# Patient Record
Sex: Female | Born: 1935 | Race: White | Hispanic: No | Smoking: Never smoker
Health system: Southern US, Community
[De-identification: ages and names within clinical notes are randomized; demographics above are authoritative.]

## PROBLEM LIST (undated history)

## (undated) DIAGNOSIS — I4891 Unspecified atrial fibrillation: Secondary | ICD-10-CM

## (undated) DIAGNOSIS — J449 Chronic obstructive pulmonary disease, unspecified: Secondary | ICD-10-CM

## (undated) DIAGNOSIS — J479 Bronchiectasis, uncomplicated: Secondary | ICD-10-CM

## (undated) DIAGNOSIS — J45909 Unspecified asthma, uncomplicated: Secondary | ICD-10-CM

## (undated) DIAGNOSIS — F419 Anxiety disorder, unspecified: Secondary | ICD-10-CM

## (undated) DIAGNOSIS — B029 Zoster without complications: Secondary | ICD-10-CM

## (undated) DIAGNOSIS — I471 Supraventricular tachycardia: Secondary | ICD-10-CM

## (undated) DIAGNOSIS — T7840XA Allergy, unspecified, initial encounter: Secondary | ICD-10-CM

## (undated) DIAGNOSIS — I10 Essential (primary) hypertension: Secondary | ICD-10-CM

## (undated) DIAGNOSIS — J918 Pleural effusion in other conditions classified elsewhere: Secondary | ICD-10-CM

## (undated) DIAGNOSIS — E43 Unspecified severe protein-calorie malnutrition: Secondary | ICD-10-CM

## (undated) DIAGNOSIS — J189 Pneumonia, unspecified organism: Secondary | ICD-10-CM

## (undated) DIAGNOSIS — Z9981 Dependence on supplemental oxygen: Secondary | ICD-10-CM

## (undated) HISTORY — DX: Anxiety disorder, unspecified: F41.9

## (undated) HISTORY — DX: Allergy, unspecified, initial encounter: T78.40XA

## (undated) HISTORY — DX: Zoster without complications: B02.9

## (undated) HISTORY — DX: Essential (primary) hypertension: I10

## (undated) HISTORY — DX: Supraventricular tachycardia: I47.1

## (undated) HISTORY — DX: Bronchiectasis, uncomplicated: J47.9

## (undated) HISTORY — DX: Pneumonia, unspecified organism: J18.9

## (undated) HISTORY — DX: Unspecified asthma, uncomplicated: J45.909

## (undated) HISTORY — PX: PILONIDAL CYST / SINUS EXCISION: SUR543

## (undated) HISTORY — DX: Chronic obstructive pulmonary disease, unspecified: J44.9

## (undated) HISTORY — DX: Pneumonia, unspecified organism: J91.8

## (undated) HISTORY — PX: BREAST SURGERY: SHX581

## (undated) HISTORY — DX: Unspecified atrial fibrillation: I48.91

## (undated) HISTORY — DX: Unspecified severe protein-calorie malnutrition: E43

## (undated) HISTORY — PX: APPENDECTOMY: SHX54

## (undated) HISTORY — PX: CATARACT EXTRACTION: SUR2

## (undated) HISTORY — DX: Dependence on supplemental oxygen: Z99.81

---

## 2019-01-02 ENCOUNTER — Non-Acute Institutional Stay (SKILLED_NURSING_FACILITY): Payer: Medicare HMO | Admitting: Adult Health

## 2019-01-02 ENCOUNTER — Encounter: Payer: Self-pay | Admitting: Adult Health

## 2019-01-02 ENCOUNTER — Inpatient Hospital Stay
Admission: RE | Admit: 2019-01-02 | Discharge: 2019-01-21 | Disposition: A | Discharge status: Home / Self Care | Payer: Medicare HMO | Source: Ambulatory Visit | Attending: Internal Medicine | Admitting: Internal Medicine

## 2019-01-02 DIAGNOSIS — J479 Bronchiectasis, uncomplicated: Secondary | ICD-10-CM

## 2019-01-02 DIAGNOSIS — D638 Anemia in other chronic diseases classified elsewhere: Secondary | ICD-10-CM

## 2019-01-02 DIAGNOSIS — R627 Adult failure to thrive: Secondary | ICD-10-CM | POA: Insufficient documentation

## 2019-01-02 DIAGNOSIS — E43 Unspecified severe protein-calorie malnutrition: Secondary | ICD-10-CM

## 2019-01-02 DIAGNOSIS — J439 Emphysema, unspecified: Secondary | ICD-10-CM | POA: Diagnosis not present

## 2019-01-02 DIAGNOSIS — I4891 Unspecified atrial fibrillation: Secondary | ICD-10-CM

## 2019-01-02 DIAGNOSIS — J9611 Chronic respiratory failure with hypoxia: Secondary | ICD-10-CM | POA: Insufficient documentation

## 2019-01-02 DIAGNOSIS — B0229 Other postherpetic nervous system involvement: Secondary | ICD-10-CM | POA: Diagnosis not present

## 2019-01-02 DIAGNOSIS — J90 Pleural effusion, not elsewhere classified: Principal | ICD-10-CM

## 2019-01-02 DIAGNOSIS — Z938 Other artificial opening status: Secondary | ICD-10-CM | POA: Insufficient documentation

## 2019-01-02 DIAGNOSIS — I10 Essential (primary) hypertension: Secondary | ICD-10-CM

## 2019-01-02 DIAGNOSIS — Z9981 Dependence on supplemental oxygen: Secondary | ICD-10-CM

## 2019-01-02 DIAGNOSIS — J189 Pneumonia, unspecified organism: Secondary | ICD-10-CM | POA: Insufficient documentation

## 2019-01-02 DIAGNOSIS — F419 Anxiety disorder, unspecified: Secondary | ICD-10-CM

## 2019-01-02 NOTE — Progress Notes (Signed)
Location:   [place] Room Number: 102 P Place of Service:  SNF (31)   CODE STATUS: DNR  Allergies  Allergen Reactions  . Vibramycin [Doxycycline Calcium]     Chief Complaint  Patient presents with  . Hospitalization Follow-up    HPI:  She is a 85 year old woman who has been hospitalized from 12-17-18 through 01-02-19. She is has a history of chronic bronchiectasis 02 dependent. She has had shingles in Jan of this year on her back.  She had worsening shortness of breath with any exertion. She was treated for pneumonia in the right lung; pleural effusion requiring a chest tube. She was treated with vanc, cefepime; flagyl. Her chest tube has been removed. For her afib rvr: has been treated with amiodarone and started on elsqius. She is on chronic 02 therapy. She is here for short term rehab with her goal to return back home. She does have fatigue shortness of breath; weight loss. She will continue to followed for her chronic illnesses: afib; bronchiectasis; plumonary emphysema. .   Past Medical History:  Diagnosis Date  . Allergy   . Anxiety   . Asthma   . Atrial fibrillation with RVR ([facility])   . Bronchiectasis ([facility])   . COPD (chronic obstructive pulmonary disease) ([facility])   . Hypertension   . On home oxygen therapy   . Severe protein-calorie malnutrition ([facility])   . Shingles    Jan 2020; on back   . SVT (supraventricular tachycardia) ([facility])     Past Surgical History:  Procedure Laterality Date  . APPENDECTOMY    . BREAST SURGERY    . CATARACT EXTRACTION    . PILONIDAL CYST / SINUS EXCISION      Social History   Socioeconomic History  . Marital status: Widowed    Spouse name: Not on file  . Number of children: 1  . Years of education: Not on file  . Highest education level: Not on file  Occupational History  . Not on file  Social Needs  . Financial resource strain: Not on file  . Food insecurity:    Worry: Not on file    Inability: Not on  file  . Transportation needs:    Medical: Not on file    Non-medical: Not on file  Tobacco Use  . Smoking status: Never Smoker  . Smokeless tobacco: Never Used  Substance and Sexual Activity  . Alcohol use: Never    Frequency: Never  . Drug use: Never  . Sexual activity: Not Currently  Lifestyle  . Physical activity:    Days per week: Not on file    Minutes per session: Not on file  . Stress: Not on file  Relationships  . Social connections:    Talks on phone: Not on file    Gets together: Not on file    Attends religious service: Not on file    Active member of club or organization: Not on file    Attends meetings of clubs or organizations: Not on file    Relationship status: Not on file  . Intimate partner violence:    Fear of current or ex partner: Not on file    Emotionally abused: Not on file    Physically abused: Not on file    Forced sexual activity: Not on file  Other Topics Concern  . Not on file  Social History Narrative  . Not on file   Family History  Problem Relation  Age of Onset  . Hypertension Sister   . Heart disease Brother       VITAL SIGNS BP (!) 108/56   Pulse 78   Temp 98.2 F (36.8 C)   Resp 16   Ht 5' 6"  (1.676 m)   Wt 101 lb 6.4 oz (46 kg)   SpO2 95%   BMI 16.37 kg/m   Outpatient Encounter Medications as of 01/02/2019  Medication Sig  . acetaminophen (TYLENOL) 500 MG tablet Take 500 mg by mouth every 6 (six) hours as needed.  Marland Kitchen albuterol (PROVENTIL) (2.5 MG/3ML) 0.083% nebulizer solution Take 2.5 mg by nebulization every 4 (four) hours as needed for wheezing or shortness of breath.  . ALPRAZolam (XANAX) 0.25 MG tablet Take 0.125 mg by mouth daily as needed for anxiety.  Derrill Memo ON 01/07/2019] amiodarone (PACERONE) 200 MG tablet Take 200 mg by mouth daily.  Marland Kitchen amiodarone (PACERONE) 400 MG tablet Take 400 mg by mouth daily.  Marland Kitchen apixaban (ELIQUIS) 2.5 MG TABS tablet Take 2.5 mg by mouth 2 (two) times daily.  . bisacodyl (FLEET) 10  MG/30ML ENEM Place 10 mg rectally daily as needed.  . cholecalciferol (VITAMIN D3) 25 MCG (1000 UT) tablet Take 1,000 Units by mouth daily.  . clotrimazole-betamethasone (LOTRISONE) cream Apply 1 application topically 2 (two) times daily. To buttocks  . Cyanocobalamin (VITAMIN B 12) 100 MCG LOZG Take 1 tablet by mouth daily.  Marland Kitchen gabapentin (NEURONTIN) 100 MG capsule Take 100 mg by mouth 2 (two) times daily.  . metoprolol tartrate (LOPRESSOR) 25 MG tablet Take 12.5 mg by mouth 2 (two) times daily.   No facility-administered encounter medications on file as of 01/02/2019.      SIGNIFICANT DIAGNOSTIC EXAMS  TODAY:   12-17-18: CT angio pulmonary: 1. Moderate size, multiloculated subpulmonic right pleural effusion. possible infection/empyema. Atelectasis or consolidation in the right lower lobe. Mediastinal and right hilar lymphadenopathy.  2. Scattered bronchiectasis predominating in the lung bases with mucous plugging. Scattered pulmonary nodularity likely a combination of mucous plugging and bronchiolitis. Recommended attention on follow up imaging.  3. Trace perihepatic ascites. 4. Negative for pulmonary embolism.   12-17-18: chest x-ray: 1. Diffuse interstitial prominence and confluent opacities at the lung bases, likely reflecting combination of broncheectasis, mucous plugging and parenchymal disease as seen at the lung bases on the ct for 11-09-18.   12-23-18: chest x-ray: stable appearance of bilateral airspace opacities and bilateral pleural effusions   12-24-18: chest x-ray: no change. Small bilateral pleural effusions. Right pigtail chest tube. Infiltrates in the mid and lower lobes   12-27-18: chest x-ray: slightly improved bilateral infiltrate/edema. No pneumothorax    12-31-18: ultrasound left thoracentesis: 600 cc fluid   12-31-18: chest x-ray: 1. Interval decrease in size of left pleural effusion, now small. No pneumothorax. 2. Unchanged tiny right effusion and bilateral heterogeneous  opacities worst in the right lower lobe.     LABS REVIEWED TODAY:   12-17-18: wbc 35.4; hgb 11.1; hct 35.1; mcv 94; plt 457; glucose 89; bun 25; creat 0.50; k+ 4.9; na++ 132; alk phos 260; albumin 2.6  12-21-18: wbc 15.8;hgb 10.5; hct 33.4; mcv 95 plt 424; glucose 88; bun 25; creat 0.70; k+ 4.2; na++ 133; ca 8.3  pleural fluid cultre: moderate growth streptococcus intermedius  12-24-18: wbc 11.4; hgb 9.7; hct 30.8; mcv 94 plt 358 glucose 90; bun 17; creat 0.60; k+ 3.6; na++ 128; ca 7.5; ast 69; albumin 2.4 mag 1.5  12-26-18: wbc 12.3; hgb 10.6; hct 33.9; mcv 94  plt 400; glucose 82; bun 20; creat 0.70; k+ 3.8; na++ 128; ca 7.7 12-30-18: wbc 11.; hgb 9.7; hct 31.1; mcv 95 plt 371; glucose 86; bun 18; creat 0.60; k+ 4.6; na++134; ca 8.1; C02 32  01-01-19: wbc 9.0; hgb 8.6; hct 27.3; mcv 93 plt 348; glucose 84; bun 20; creat 0.80 ;k+ 3.5; na++ 140; ca 7.8; C02 42    Review of Systems  Constitutional: Positive for malaise/fatigue and weight loss.  Respiratory: Positive for shortness of breath. Negative for cough.   Cardiovascular: Negative for chest pain, palpitations and leg swelling.  Gastrointestinal: Negative for abdominal pain, constipation and heartburn.  Musculoskeletal: Negative for back pain, joint pain and myalgias.  Skin: Negative.   Neurological: Negative for dizziness.  Psychiatric/Behavioral: The patient is not nervous/anxious.     Physical Exam Constitutional:      General: She is not in acute distress.    Appearance: She is not diaphoretic.     Comments: Cachexia   Neck:     Thyroid: No thyromegaly.  Cardiovascular:     Rate and Rhythm: Normal rate. Rhythm irregular.     Pulses: Normal pulses.     Heart sounds: Murmur present.     Comments: 2/6 Pulmonary:     Effort: Pulmonary effort is normal. No respiratory distress.     Comments: 02 dependent at 2 L Diminished breath sounds  Abdominal:     General: Bowel sounds are normal. There is no distension.     Palpations:  Abdomen is soft.     Tenderness: There is no abdominal tenderness.  Musculoskeletal:     Right lower leg: Edema present.     Left lower leg: Edema present.     Comments: 2+ bilateral lower extremity edema L>R Is able to move to all extremities   Lymphadenopathy:     Cervical: No cervical adenopathy.  Skin:    General: Skin is warm and dry.     Comments: Fragile skin   Neurological:     Mental Status: She is alert and oriented to person, place, and time.  Psychiatric:        Mood and Affect: Mood normal.       ASSESSMENT/ PLAN:  TODAY:   1. Atrial fibrillation with RVR: heart rate is stable: will continue amiodarone 400 mg through 3-42-20 then 200 mg daily through 01-07-19 and lopressor 12.5 mg twice daily for rate control . Will continue eliquis 2.5 mg twice daily   2. Post herpetic neuralgia: right side pain is being managed will continue neurontin 100 mg twice daily   3. 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 Essential hypertension: is stable b/p 108/56: will continue lopressor 12.5 mg twice daily   5. Chronic anxiety: is stable will continue xanax 0.125 mg daily as needed for 14 days   6. Severe protein calorie malnutrition/failure to thrive in adult: her albumin 2.4 will begin ensure twice daily weight is 101 pounds   7. Anemia of chronic disease: without change hgb 8.6 will monitor   8. Bilateral pleural effusions: is status post left thoracentesis and right chest tube: will monitor   9. Right lower lobe pneumonia: has completed abt will monitor    Will check cbc; cmp mag.    MD is aware of resident's narcotic use and is in agreement with current plan of care.  We will attempt to wean resident as apropriate   Ok Edwards NP [facility]  Contact  [phone] Monday through Friday 8am- 5pm  After hours call [phone]

## 2019-01-05 ENCOUNTER — Encounter: Payer: Self-pay | Admitting: Internal Medicine

## 2019-01-05 NOTE — Progress Notes (Deleted)
Provider:  Einar Crow, MD Location:  [facility] Room Number: 102 P Place of Service:  SNF (31)  PCP: Mahlon Gammon, MD Patient Care Team: Mahlon Gammon, MD as PCP - General (Internal Medicine)  Extended Emergency Contact Information Primary Emergency Contact: [facility] Address: [address]          [place], [place] [address] [place] Work Phone: [phone] Relation: Other  Code Status: Full Code Goals of Care: Advanced Directive information Advanced Directives 01/05/2019  Does Patient Have a Medical Advance Directive? No  Type of Advance Directive -  Does patient want to make changes to medical advance directive? No - Patient declined  Would patient like information on creating a medical advance directive? No - Patient declined      Chief Complaint  Patient presents with  . New Admit To SNF    Admission    HPI: Patient is a 85 y.o. female seen today for admission to  Past Medical History:  Diagnosis Date  . Allergy   . Anxiety   . Asthma   . Atrial fibrillation with RVR ([facility])   . Bronchiectasis ([facility])   . COPD (chronic obstructive pulmonary disease) ([facility])   . Hypertension   . On home oxygen therapy   . Severe protein-calorie malnutrition ([facility])   . Shingles    Jan 2020; on back   . SVT (supraventricular tachycardia) ([facility])    Past Surgical History:  Procedure Laterality Date  . APPENDECTOMY    . BREAST SURGERY    . CATARACT EXTRACTION    . PILONIDAL CYST / SINUS EXCISION      reports that she has never smoked. She has never used smokeless tobacco. She reports that she does not drink alcohol or use drugs. Social History   Socioeconomic History  . Marital status: Widowed    Spouse name: Not on file  . Number of children: 1  . Years of education: Not on file  . Highest education level: Not on file  Occupational History  . Not on file  Social Needs  . Financial resource strain: Not  on file  . Food insecurity:    Worry: Not on file    Inability: Not on file  . Transportation needs:    Medical: Not on file    Non-medical: Not on file  Tobacco Use  . Smoking status: Never Smoker  . Smokeless tobacco: Never Used  Substance and Sexual Activity  . Alcohol use: Never    Frequency: Never  . Drug use: Never  . Sexual activity: Not Currently  Lifestyle  . Physical activity:    Days per week: Not on file    Minutes per session: Not on file  . Stress: Not on file  Relationships  . Social connections:    Talks on phone: Not on file    Gets together: Not on file    Attends religious service: Not on file    Active member of club or organization: Not on file    Attends meetings of clubs or organizations: Not on file    Relationship status: Not on file  . Intimate partner violence:    Fear of current or ex partner: Not on file    Emotionally abused: Not on file    Physically abused: Not on file    Forced sexual activity: Not on file  Other Topics Concern  . Not on file  Social History  Narrative  . Not on file    Functional Status Survey:    Family History  Problem Relation Age of Onset  . Hypertension Sister   . Heart disease Brother     Health Maintenance  Topic Date Due  . INFLUENZA VACCINE  01/13/2019 (Originally 05/15/2018)  . DEXA SCAN  02/05/2019 (Originally 01/27/1998)  . TETANUS/TDAP  02/05/2019 (Originally 01/28/1950)  . PNA vac Low Risk Adult (1 of 2 - PCV13) 02/05/2019 (Originally 01/27/1998)    Allergies  Allergen Reactions  . Vibramycin [Doxycycline Calcium]     Outpatient Encounter Medications as of 01/05/2019  Medication Sig  . acetaminophen (TYLENOL) 500 MG tablet Take 500 mg by mouth every 6 (six) hours as needed.  Marland Kitchen albuterol (PROVENTIL) (2.5 MG/3ML) 0.083% nebulizer solution Take 2.5 mg by nebulization every 4 (four) hours as needed for wheezing or shortness of breath.  . ALPRAZolam (XANAX) 0.25 MG tablet Take 0.125 mg by mouth at  bedtime as needed for anxiety.   Melene Muller ON 01/07/2019] amiodarone (PACERONE) 200 MG tablet Take 200 mg by mouth daily. Beginning 01/07/2019  . amiodarone (PACERONE) 400 MG tablet Take 400 mg by mouth daily. Ending 01/06/19  . apixaban (ELIQUIS) 2.5 MG TABS tablet Take 2.5 mg by mouth 2 (two) times daily.  . bisacodyl (FLEET) 10 MG/30ML ENEM Place 10 mg rectally daily as needed.  . cholecalciferol (VITAMIN D3) 25 MCG (1000 UT) tablet Take 1,000 Units by mouth daily.  . clotrimazole-betamethasone (LOTRISONE) cream Apply 1 application topically 2 (two) times daily. To buttocks  . Cyanocobalamin (VITAMIN B 12) 100 MCG LOZG Take 1 tablet by mouth daily.  Marland Kitchen gabapentin (NEURONTIN) 100 MG capsule Take 100 mg by mouth 2 (two) times daily.  . metoprolol tartrate (LOPRESSOR) 25 MG tablet Take 12.5 mg by mouth 2 (two) times daily.  . NON FORMULARY Diet Type:  NAS  . Nutritional Supplements (ENSURE ENLIVE PO) Take 1 Bottle by mouth 2 (two) times daily between meals.  Sherren Mocha Supplies (SKIN PREP WIPES) MISC Apply topically to bilateral heels every shift  . OXYGEN Inhale 2 L/min into the lungs continuous.   No facility-administered encounter medications on file as of 01/05/2019.     Review of Systems  Vitals:   01/05/19 0856  BP: (!) 115/58  Pulse: (!) 54  Resp: 19  Temp: (!) 97.5 F (36.4 C)  SpO2: 98%  Weight: 101 lb 6.4 oz (46 kg)  Height: 5\' 6"  (1.676 m)   Body mass index is 16.37 kg/m. Physical Exam  Labs reviewed: Basic Metabolic Panel: No results for input(s): NA, K, CL, CO2, GLUCOSE, BUN, CREATININE, CALCIUM, MG, PHOS in the last 8760 hours. Liver Function Tests: No results for input(s): AST, ALT, ALKPHOS, BILITOT, PROT, ALBUMIN in the last 8760 hours. No results for input(s): LIPASE, AMYLASE in the last 8760 hours. No results for input(s): AMMONIA in the last 8760 hours. CBC: No results for input(s): WBC, NEUTROABS, HGB, HCT, MCV, PLT in the last 8760 hours. Cardiac Enzymes: No  results for input(s): CKTOTAL, CKMB, CKMBINDEX, TROPONINI in the last 8760 hours. BNP: Invalid input(s): POCBNP No results found for: HGBA1C No results found for: TSH No results found for: VITAMINB12 No results found for: FOLATE No results found for: IRON, TIBC, FERRITIN  Imaging and Procedures obtained prior to SNF admission: No results found.  Assessment/Plan There are no diagnoses linked to this encounter.   Family/ staff Communication:   Labs/tests ordered:

## 2019-01-06 ENCOUNTER — Non-Acute Institutional Stay (SKILLED_NURSING_FACILITY): Payer: Medicare HMO | Admitting: Internal Medicine

## 2019-01-06 ENCOUNTER — Encounter (HOSPITAL_COMMUNITY)
Admission: RE | Admit: 2019-01-06 | Discharge: 2019-01-06 | Disposition: A | Discharge status: Home / Self Care | Payer: Medicare HMO | Source: Skilled Nursing Facility | Attending: Adult Health | Admitting: Adult Health

## 2019-01-06 ENCOUNTER — Encounter: Payer: Self-pay | Admitting: Internal Medicine

## 2019-01-06 DIAGNOSIS — E871 Hypo-osmolality and hyponatremia: Secondary | ICD-10-CM

## 2019-01-06 DIAGNOSIS — Z938 Other artificial opening status: Secondary | ICD-10-CM

## 2019-01-06 DIAGNOSIS — E43 Unspecified severe protein-calorie malnutrition: Secondary | ICD-10-CM

## 2019-01-06 DIAGNOSIS — R600 Localized edema: Secondary | ICD-10-CM | POA: Diagnosis not present

## 2019-01-06 DIAGNOSIS — I4891 Unspecified atrial fibrillation: Secondary | ICD-10-CM

## 2019-01-06 DIAGNOSIS — J439 Emphysema, unspecified: Secondary | ICD-10-CM

## 2019-01-06 DIAGNOSIS — J470 Bronchiectasis with acute lower respiratory infection: Secondary | ICD-10-CM | POA: Insufficient documentation

## 2019-01-06 DIAGNOSIS — J90 Pleural effusion, not elsewhere classified: Secondary | ICD-10-CM | POA: Insufficient documentation

## 2019-01-06 DIAGNOSIS — J449 Chronic obstructive pulmonary disease, unspecified: Secondary | ICD-10-CM | POA: Insufficient documentation

## 2019-01-06 DIAGNOSIS — J189 Pneumonia, unspecified organism: Secondary | ICD-10-CM | POA: Insufficient documentation

## 2019-01-06 DIAGNOSIS — D638 Anemia in other chronic diseases classified elsewhere: Secondary | ICD-10-CM

## 2019-01-06 LAB — COMPREHENSIVE METABOLIC PANEL
ALT: 18 U/L (ref 0–44)
AST: 29 U/L (ref 15–41)
Albumin: 2.3 g/dL — ABNORMAL LOW (ref 3.5–5.0)
Alkaline Phosphatase: 83 U/L (ref 38–126)
Anion gap: 6 (ref 5–15)
BUN: 28 mg/dL — ABNORMAL HIGH (ref 8–23)
CO2: 40 mmol/L — ABNORMAL HIGH (ref 22–32)
Calcium: 8.1 mg/dL — ABNORMAL LOW (ref 8.9–10.3)
Chloride: 92 mmol/L — ABNORMAL LOW (ref 98–111)
Creatinine, Ser: 0.85 mg/dL (ref 0.44–1.00)
GFR calc Af Amer: >60 mL/min (ref >60)
GFR calc non Af Amer: >60 mL/min (ref >60)
Glucose, Bld: 82 mg/dL (ref 70–99)
Potassium: 4.4 mmol/L (ref 3.5–5.1)
Sodium: 138 mmol/L (ref 135–145)
Total Bilirubin: 0.4 mg/dL (ref 0.3–1.2)
Total Protein: 5.4 g/dL — ABNORMAL LOW (ref 6.5–8.1)

## 2019-01-06 LAB — CBC WITH DIFFERENTIAL/PLATELET
Abs Immature Granulocytes: 0.05 10*3/uL (ref 0.00–0.07)
BASOS PCT: 1 %
Basophils Absolute: 0.1 10*3/uL (ref 0.0–0.1)
Eosinophils Absolute: 0 10*3/uL (ref 0.0–0.5)
Eosinophils Relative: 0 %
HCT: 29.1 % — ABNORMAL LOW (ref 36.0–46.0)
Hemoglobin: 8.8 g/dL — ABNORMAL LOW (ref 12.0–15.0)
Immature Granulocytes: 1 %
Lymphocytes Relative: 24 %
Lymphs Abs: 1.9 10*3/uL (ref 0.7–4.0)
MCH: 29.1 pg (ref 26.0–34.0)
MCHC: 30.2 g/dL (ref 30.0–36.0)
MCV: 96.4 fL (ref 80.0–100.0)
Monocytes Absolute: 0.6 10*3/uL (ref 0.1–1.0)
Monocytes Relative: 7 %
NRBC: 0 % (ref 0.0–0.2)
Neutro Abs: 5.3 10*3/uL (ref 1.7–7.7)
Neutrophils Relative %: 67 %
PLATELETS: 268 10*3/uL (ref 150–400)
RBC: 3.02 MIL/uL — ABNORMAL LOW (ref 3.87–5.11)
RDW: 16.6 % — ABNORMAL HIGH (ref 11.5–15.5)
WBC: 8 10*3/uL (ref 4.0–10.5)

## 2019-01-06 LAB — MAGNESIUM: Magnesium: 1.9 mg/dL (ref 1.7–2.4)

## 2019-01-06 NOTE — Progress Notes (Signed)
Entered in error

## 2019-01-06 NOTE — Progress Notes (Signed)
Provider:  Einar Crow, MD Location:  [facility] Room Number: 102 P Place of Service:  SNF ([id number])  PCP: Brandy Gammon, MD Patient Care Team: Brandy Gammon, MD as PCP - General (Internal Medicine)  Extended Emergency Contact Information Primary Emergency Contact: Brandy Carter, [facility] Address: [address]          [place], [place] [address] [place] Work Phone: [phone] Relation: Other  Code Status: Full Code Goals of Care: Advanced Directive information Advanced Directives 01/06/2019  Does Patient Have a Medical Advance Directive? No  Type of Advance Directive -  Does patient want to make changes to medical advance directive? No - Patient declined  Would patient like information on creating a medical advance directive? No - Patient declined      Chief Complaint  Patient presents with  . New Admit To SNF    Admission    HPI: Patient is a 85 y.o. female seen today for admission to SNF for therapy. Patient was admitted in [facility] [place] 3[place]4 to 3/24 for right parapneumonic effusion, A. fib new onset A. fib with RVR, and hyponatremia. Patient has past medical history of COPD on chronic oxygen, hyponatremia, hypertension and recent herpes zoster in 01 /20  Patient was admitted from her cardiologist office for severe shortness of breath and exertion.  She states that her health has been going downhill since she was diagnosed with shingles on her back in January.  In ED she was found to have a moderate-sized multifocal sub-pulmonic right pleural effusion.  Eventually she underwent chest tube placement.  Her fluid grew strep intermedius and she was treated with 2 weeks of ceftriaxone.  She also had left-sided pleural effusion but the fluid was transudate   Patient also developed atrial fibrillation with RVR.  It did not get control with diltiazem and she was started on IV amiodarone loading dose.  She was also started  on Eliquis. Patient also has a history of hyponatremia which got worsened in the hospital at 126.  Eventually she was treated with Lasix. For her right-sided pain Neurontin was added which helped Her EF was normal. Patient has significantly become weak since then.  She is in SNF for therapy.  She says she feels much better has severe lower extremity edema.  Denies any cough shortness of breath or chest pain. Patient lives by herself but her son lives in different city is very supportive.  Patient was driving before this admission.  Past Medical History:  Diagnosis Date  . Allergy   . Anxiety   . Asthma   . Atrial fibrillation with RVR (HCC)   . Bronchiectasis (HCC)   . COPD (chronic obstructive pulmonary disease) (HCC)   . Hypertension   . On home oxygen therapy   . Severe protein-calorie malnutrition (HCC)   . Shingles    Jan 2020; on back   . SVT (supraventricular tachycardia) (HCC)    Past Surgical History:  Procedure Laterality Date  . APPENDECTOMY    . BREAST SURGERY    . CATARACT EXTRACTION    . PILONIDAL CYST / SINUS EXCISION      reports that she has never smoked. She has never used smokeless tobacco. She reports that she does not drink alcohol or use drugs. Social History   Socioeconomic History  . Marital status: Widowed    Spouse name: Not on file  . Number of children: 1  . Years  of education: Not on file  . Highest education level: Not on file  Occupational History  . Not on file  Social Needs  . Financial resource strain: Not on file  . Food insecurity:    Worry: Not on file    Inability: Not on file  . Transportation needs:    Medical: Not on file    Non-medical: Not on file  Tobacco Use  . Smoking status: Never Smoker  . Smokeless tobacco: Never Used  Substance and Sexual Activity  . Alcohol use: Never    Frequency: Never  . Drug use: Never  . Sexual activity: Not Currently  Lifestyle  . Physical activity:    Days per week: Not on file     Minutes per session: Not on file  . Stress: Not on file  Relationships  . Social connections:    Talks on phone: Not on file    Gets together: Not on file    Attends religious service: Not on file    Active member of club or organization: Not on file    Attends meetings of clubs or organizations: Not on file    Relationship status: Not on file  . Intimate partner violence:    Fear of current or ex partner: Not on file    Emotionally abused: Not on file    Physically abused: Not on file    Forced sexual activity: Not on file  Other Topics Concern  . Not on file  Social History Narrative  . Not on file    Functional Status Survey:    Family History  Problem Relation Age of Onset  . Hypertension Sister   . Heart disease Brother     Health Maintenance  Topic Date Due  . INFLUENZA VACCINE  01/13/2019 (Originally 05/15/2018)  . DEXA SCAN  02/05/2019 (Originally 01/27/1998)  . TETANUS/TDAP  02/05/2019 (Originally 01/28/1950)  . PNA vac Low Risk Adult (1 of 2 - PCV13) 02/05/2019 (Originally 01/27/1998)    Allergies  Allergen Reactions  . Vibramycin [Doxycycline Calcium]     Outpatient Encounter Medications as of 01/06/2019  Medication Sig  . acetaminophen (TYLENOL) 500 MG tablet Take 500 mg by mouth every 6 (six) hours as needed.  Marland Kitchen. albuterol (PROVENTIL) (2.5 MG/3ML) 0.083% nebulizer solution Take 2.5 mg by nebulization every 4 (four) hours as needed for wheezing or shortness of breath.  . ALPRAZolam (XANAX) 0.25 MG tablet Take 0.125 mg by mouth at bedtime as needed for anxiety.   Melene Muller. [START ON 01/07/2019] amiodarone (PACERONE) 200 MG tablet Take 200 mg by mouth daily. Beginning 01/07/2019  . amiodarone (PACERONE) 400 MG tablet Take 400 mg by mouth daily. Ending 01/06/19  . apixaban (ELIQUIS) 2.5 MG TABS tablet Take 2.5 mg by mouth 2 (two) times daily.  . bisacodyl (FLEET) 10 MG/30ML ENEM Place 10 mg rectally daily as needed.  . cholecalciferol (VITAMIN D3) 25 MCG (1000 UT) tablet  Take 1,000 Units by mouth daily.  . clotrimazole-betamethasone (LOTRISONE) cream Apply 1 application topically 2 (two) times daily. To buttocks  . Cyanocobalamin (VITAMIN B 12) 100 MCG LOZG Take 1 tablet by mouth daily.  Marland Kitchen. gabapentin (NEURONTIN) 100 MG capsule Take 100 mg by mouth 2 (two) times daily.  . metoprolol tartrate (LOPRESSOR) 25 MG tablet Take 12.5 mg by mouth 2 (two) times daily.  . NON FORMULARY Diet Type:  NAS  . Nutritional Supplements (ENSURE ENLIVE PO) Take 1 Bottle by mouth 2 (two) times daily between meals.  . Sherren Mochastomy  Supplies (SKIN PREP WIPES) MISC Apply topically to bilateral heels every shift  . OXYGEN Inhale 2 L/min into the lungs continuous.   No facility-administered encounter medications on file as of 01/06/2019.     Review of Systems  Constitutional: Positive for activity change and unexpected weight change.  HENT: Negative.   Respiratory: Positive for cough.   Cardiovascular: Positive for leg swelling.  Gastrointestinal: Positive for diarrhea.  Genitourinary: Negative.   Musculoskeletal: Negative.   Skin: Negative.   Neurological: Positive for weakness.  Psychiatric/Behavioral: Negative.   All other systems reviewed and are negative.   Vitals:   01/06/19 1013  BP: (!) 144/66  Pulse: 64  Resp: 18  Temp: 98.4 F (36.9 C)  SpO2: 99%  Weight: 101 lb 6.4 oz (46 kg)  Height: 5\' 6"  (1.676 m)   Body mass index is 16.37 kg/m. Physical Exam Vitals signs reviewed.  Constitutional:      Appearance: Normal appearance.  HENT:     Head: Normocephalic.     Nose: Nose normal.     Mouth/Throat:     Mouth: Mucous membranes are moist.     Pharynx: Oropharynx is clear.  Eyes:     Pupils: Pupils are equal, round, and reactive to light.  Neck:     Musculoskeletal: Neck supple.  Cardiovascular:     Rate and Rhythm: Normal rate. Rhythm irregular.     Pulses: Normal pulses.  Pulmonary:     Effort: Pulmonary effort is normal.     Comments: Had Crackles in left  Lung Right had decreased BS Abdominal:     General: Abdomen is flat. Bowel sounds are normal.     Palpations: Abdomen is soft.  Musculoskeletal:     Comments: Has Mod Edema Bilateral with petechiae   Skin:    General: Skin is warm.     Comments: Healed Zoster rash on her Right Back  Neurological:     General: No focal deficit present.     Mental Status: She is alert and oriented to person, place, and time.  Psychiatric:        Mood and Affect: Mood normal.        Thought Content: Thought content normal.        Judgment: Judgment normal.     Labs reviewed: Basic Metabolic Panel: Recent Labs    01/06/19 0600  NA 138  K 4.4  CL 92*  CO2 40*  GLUCOSE 82  BUN 28*  CREATININE 0.85  CALCIUM 8.1*  MG 1.9   Liver Function Tests: Recent Labs    01/06/19 0600  AST 29  ALT 18  ALKPHOS 83  BILITOT 0.4  PROT 5.4*  ALBUMIN 2.3*   No results for input(s): LIPASE, AMYLASE in the last 8760 hours. No results for input(s): AMMONIA in the last 8760 hours. CBC: Recent Labs    01/06/19 0600  WBC 8.0  NEUTROABS 5.3  HGB 8.8*  HCT 29.1*  MCV 96.4  PLT 268   Cardiac Enzymes: No results for input(s): CKTOTAL, CKMB, CKMBINDEX, TROPONINI in the last 8760 hours. BNP: Invalid input(s): POCBNP No results found for: HGBA1C No results found for: TSH No results found for: VITAMINB12 No results found for: FOLATE No results found for: IRON, TIBC, FERRITIN  Imaging and Procedures obtained prior to SNF admission: No results found.  Assessment/Plan Bilateral leg edema Will start her on Lasix Low dose 20 mg with potassiunm Repeat Bmp in 1 week to follow her Sodium Her Weight is 20  lbs more then her baseline COPD On Chronic Oxygen but not on any inhalers  Hyponatremia Was normal today but will follow closely on Lasix  S/P chest tube placement for Right Parapnuemonic Effusion/Empyemea Finished antibiotics for 2 weeks in hospital Continue to monitor Clinically  Severe  protein-calorie malnutrition  Talked to Dietician for Supplements  Anemia  Check Stool for Blood Iron studies Start her on iron  Atrial fibrillation with RVR  In sinus rhythm On Eliquis On Amiodarone Will need follow up with Cardiology    Family/ staff Communication:   Labs/tests ordered:

## 2019-01-07 ENCOUNTER — Other Ambulatory Visit (HOSPITAL_COMMUNITY)
Admission: RE | Admit: 2019-01-07 | Discharge: 2019-01-07 | Disposition: A | Discharge status: Home / Self Care | Payer: Medicare HMO | Source: Skilled Nursing Facility | Attending: Internal Medicine | Admitting: Internal Medicine

## 2019-01-07 DIAGNOSIS — Z7901 Long term (current) use of anticoagulants: Secondary | ICD-10-CM | POA: Insufficient documentation

## 2019-01-07 LAB — OCCULT BLOOD X 1 CARD TO LAB, STOOL: Fecal Occult Bld: POSITIVE — AB

## 2019-01-08 ENCOUNTER — Non-Acute Institutional Stay (SKILLED_NURSING_FACILITY): Payer: Medicare HMO | Admitting: Internal Medicine

## 2019-01-08 ENCOUNTER — Encounter: Payer: Self-pay | Admitting: Internal Medicine

## 2019-01-08 DIAGNOSIS — I4891 Unspecified atrial fibrillation: Secondary | ICD-10-CM

## 2019-01-08 DIAGNOSIS — R195 Other fecal abnormalities: Secondary | ICD-10-CM

## 2019-01-08 DIAGNOSIS — D638 Anemia in other chronic diseases classified elsewhere: Secondary | ICD-10-CM

## 2019-01-08 DIAGNOSIS — R600 Localized edema: Secondary | ICD-10-CM

## 2019-01-08 NOTE — Progress Notes (Signed)
Location:  [facility] Room Number: 102 P Place of Service:  SNF [id number]) Provider:  Einar Crow, MD  Mahlon Gammon, MD  Patient Care Team: Mahlon Gammon, MD as PCP - General (Internal Medicine)  Extended Emergency Contact Information Primary Emergency Contact: MCNEILL'S, [facility] Address: [address]          [place], [place] [address] [place] Work Phone: [phone] Relation: Other  Code Status:  Full Code Goals of care: Advanced Directive information Advanced Directives 01/08/2019  Does Patient Have a Medical Advance Directive? No  Type of Advance Directive -  Does patient want to make changes to medical advance directive? No - Patient declined  Would patient like information on creating a medical advance directive? No - Patient declined     Chief Complaint  Patient presents with  . Acute Visit    Occult Positive    HPI:  Pt is a 85 y.o. female seen today for an acute visit for Occult positive stool and LE swelling  Patient was admitted in [facility] [place] 3/4 to 3/24 for right parapneumonic effusion, A. fib new onset A. fib with RVR, and hyponatremia.  Patient has past medical history of COPD on chronic oxygen, hyponatremia, hypertension and recent herpes zoster in 01 /20  She was admitted from her cardiologist office for severe shortness of breath and exertion.  She states that her health has been going downhill since she was diagnosed with shingles on her back in January.  In ED she was found to have a moderate-sized multifocal sub-pulmonic right pleural effusion.  Eventually she underwent chest tube placement.  Her fluid grew strep intermedius and she was treated with 2 weeks of ceftriaxone.  She also had left-sided pleural effusion but the fluid was transudate   Patient also developed atrial fibrillation with RVR. On Amiodarone and Eliquis  Patient also has a history of hyponatremia which got worsened  in the hospital at 126.  Eventually she was treated with Lasix. For her right-sided pain Neurontin was added which helped Her EF was normal. She is doing well since then  Due to her anemia I had ordered Occult Stool and so far 1 has been positive. But has not had any signs of bleeding. She is also on Lasix to for her edema. Nurses are wrapping it also to reduce the Leakage from her leg It is helping patient. She is working with therapy and is able to walk now with walker few steps Continues to be SOB which is her baseline. On her Oxygen.   Past Medical History:  Diagnosis Date  . Allergy   . Anxiety   . Asthma   . Atrial fibrillation with RVR (HCC)   . Bronchiectasis (HCC)   . COPD (chronic obstructive pulmonary disease) (HCC)   . Hypertension   . On home oxygen therapy   . Severe protein-calorie malnutrition (HCC)   . Shingles    Jan 2020; on back   . SVT (supraventricular tachycardia) (HCC)    Past Surgical History:  Procedure Laterality Date  . APPENDECTOMY    . BREAST SURGERY    . CATARACT EXTRACTION    . PILONIDAL CYST / SINUS EXCISION      Allergies  Allergen Reactions  . Vibramycin [Doxycycline Calcium]     Outpatient Encounter Medications as of 01/08/2019  Medication Sig  . acetaminophen (TYLENOL) 500 MG tablet Take 500 mg by mouth every 6 (six)  hours as needed.  Marland Kitchen albuterol (PROVENTIL) (2.5 MG/3ML) 0.083% nebulizer solution Take 2.5 mg by nebulization every 4 (four) hours as needed for wheezing or shortness of breath.  . ALPRAZolam (XANAX) 0.25 MG tablet Take 0.125 mg by mouth at bedtime as needed for anxiety.   Marland Kitchen amiodarone (PACERONE) 200 MG tablet Take 200 mg by mouth daily. Beginning 01/07/2019  . apixaban (ELIQUIS) 2.5 MG TABS tablet Take 2.5 mg by mouth 2 (two) times daily.  . bisacodyl (FLEET) 10 MG/30ML ENEM Place 10 mg rectally daily as needed.  . cholecalciferol (VITAMIN D3) 25 MCG (1000 UT) tablet Take 1,000 Units by mouth daily.  .  clotrimazole-betamethasone (LOTRISONE) cream Apply 1 application topically 2 (two) times daily. To buttocks  . Cyanocobalamin (VITAMIN B 12) 100 MCG LOZG Take 1 tablet by mouth daily.  . ferrous sulfate 325 (65 FE) MG tablet Take 325 mg by mouth daily.  . furosemide (LASIX) 20 MG tablet Take 20 mg by mouth daily.  Marland Kitchen gabapentin (NEURONTIN) 100 MG capsule Take 100 mg by mouth 2 (two) times daily.  . metoprolol tartrate (LOPRESSOR) 25 MG tablet Take 12.5 mg by mouth 2 (two) times daily.  . NON FORMULARY Diet Type:  Downgraded to grounded meat per therapy until speech therapy does evaluation next week  . Nutritional Supplements (ENSURE ENLIVE PO) Take 1 Bottle by mouth 2 (two) times daily between meals.  Sherren Mocha Supplies (SKIN PREP WIPES) MISC Apply topically to bilateral heels every shift  . OXYGEN Inhale 2 L/min into the lungs continuous.  . pantoprazole (PROTONIX) 40 MG tablet Take 40 mg by mouth daily.  . potassium chloride SA (K-DUR,KLOR-CON) 20 MEQ tablet Take 20 mEq by mouth daily.  . [DISCONTINUED] amiodarone (PACERONE) 400 MG tablet Take 400 mg by mouth daily. Ending 01/06/19   No facility-administered encounter medications on file as of 01/08/2019.     Review of Systems  Constitutional: Positive for activity change and unexpected weight change.  HENT: Negative.   Respiratory: Positive for cough.   Cardiovascular: Positive for leg swelling.  Gastrointestinal: Positive for diarrhea.  Genitourinary: Negative.   Musculoskeletal: Negative.   Skin: Negative.   Neurological: Positive for weakness.  Psychiatric/Behavioral: Negative.   All other systems reviewed and are negative.    There is no immunization history on file for this patient. Pertinent  Health Maintenance Due  Topic Date Due  . INFLUENZA VACCINE  01/13/2019 (Originally 05/15/2018)  . DEXA SCAN  02/05/2019 (Originally 01/27/1998)  . PNA vac Low Risk Adult (1 of 2 - PCV13) 02/05/2019 (Originally 01/27/1998)   No flowsheet  data found. Functional Status Survey:    Vitals:   01/08/19 1105  BP: (!) 114/51  Pulse: 63  Resp: 14  Temp: (!) 96.7 F (35.9 C)  SpO2: 98%  Weight: 103 lb 12.8 oz (47.1 kg)  Height: 5\' 6"  (1.676 m)   Body mass index is 16.75 kg/m. Physical Exam Vitals signs reviewed.  Constitutional:      Appearance: Normal appearance.  HENT:     Head: Normocephalic.     Nose: Nose normal.     Mouth/Throat:     Mouth: Mucous membranes are moist.     Pharynx: Oropharynx is clear.  Eyes:     Pupils: Pupils are equal, round, and reactive to light.  Neck:     Musculoskeletal: Neck supple.  Cardiovascular:     Rate and Rhythm: Normal rate. Rhythm irregular.     Pulses: Normal pulses.  Pulmonary:  Effort: Pulmonary effort is normal.     Comments: Had Crackles in left Lung Right had decreased BS Abdominal:     General: Abdomen is flat. Bowel sounds are normal.     Palpations: Abdomen is soft.  Musculoskeletal:     Comments: Has Mod Edema Bilateral with petechiae   Skin:    General: Skin is warm.     Comments: Healed Zoster rash on her Right Back  Neurological:     General: No focal deficit present.     Mental Status: She is alert and oriented to person, place, and time.  Psychiatric:        Mood and Affect: Mood normal.        Thought Content: Thought content normal.        Judgment: Judgment normal.     Labs reviewed: Recent Labs    01/06/19 0600  NA 138  K 4.4  CL 92*  CO2 40*  GLUCOSE 82  BUN 28*  CREATININE 0.85  CALCIUM 8.1*  MG 1.9   Recent Labs    01/06/19 0600  AST 29  ALT 18  ALKPHOS 83  BILITOT 0.4  PROT 5.4*  ALBUMIN 2.3*   Recent Labs    01/06/19 0600  WBC 8.0  NEUTROABS 5.3  HGB 8.8*  HCT 29.1*  MCV 96.4  PLT 268   No results found for: TSH No results found for: HGBA1C No results found for: CHOL, HDL, LDLCALC, LDLDIRECT, TRIG, CHOLHDL  Significant Diagnostic Results in last 30 days:  No results found.  Assessment/Plan FOBT  Will start her on Protonix QD Continue to monitor her Hgb Will continue Eliquis for now Repeat CBC pending Anemia  Iron studies Pending  on iron Bilateral Edema Continue on Lasix Also Wrapping the leg BMP pending Other issues COPD On Chronic Oxygen but not on any inhalers  Hyponatremia Was normal today but will follow closely on Lasix  S/P chest tube placement for Right Parapnuemonic Effusion/Empyemea Finished antibiotics for 2 weeks in hospital Continue to monitor Clinically  Severe protein-calorie malnutrition  Talked to Dietician for Supplements Atrial fibrillation with RVR  In sinus rhythm On Eliquis On Amiodarone Will need follow up with Cardiology Family/ staff Communication:   Labs/tests ordered:   Total time spent in this patient care encounter was 25_ minutes; greater than 50% of the visit spent counseling patient, reviewing records , Labs and coordinating care for problems addressed at this encounter.

## 2019-01-10 ENCOUNTER — Encounter (HOSPITAL_COMMUNITY)
Admission: RE | Admit: 2019-01-10 | Discharge: 2019-01-10 | Disposition: A | Discharge status: Home / Self Care | Payer: Medicare HMO | Source: Skilled Nursing Facility | Attending: *Deleted | Admitting: *Deleted

## 2019-01-10 LAB — OCCULT BLOOD X 1 CARD TO LAB, STOOL: Fecal Occult Bld: POSITIVE — AB

## 2019-01-11 ENCOUNTER — Encounter (HOSPITAL_COMMUNITY)
Admission: RE | Admit: 2019-01-11 | Discharge: 2019-01-11 | Disposition: A | Discharge status: Home / Self Care | Payer: Medicare HMO | Source: Skilled Nursing Facility | Attending: *Deleted | Admitting: *Deleted

## 2019-01-11 LAB — OCCULT BLOOD X 1 CARD TO LAB, STOOL: Fecal Occult Bld: POSITIVE — AB

## 2019-01-12 ENCOUNTER — Encounter: Payer: Self-pay | Admitting: Internal Medicine

## 2019-01-12 ENCOUNTER — Encounter (HOSPITAL_COMMUNITY)
Admission: RE | Admit: 2019-01-12 | Discharge: 2019-01-12 | Disposition: A | Discharge status: Home / Self Care | Payer: Medicare HMO | Source: Skilled Nursing Facility | Attending: *Deleted | Admitting: *Deleted

## 2019-01-12 ENCOUNTER — Non-Acute Institutional Stay (SKILLED_NURSING_FACILITY): Payer: Medicare HMO | Admitting: Internal Medicine

## 2019-01-12 DIAGNOSIS — I10 Essential (primary) hypertension: Secondary | ICD-10-CM

## 2019-01-12 DIAGNOSIS — D638 Anemia in other chronic diseases classified elsewhere: Secondary | ICD-10-CM | POA: Diagnosis not present

## 2019-01-12 DIAGNOSIS — K921 Melena: Secondary | ICD-10-CM | POA: Diagnosis not present

## 2019-01-12 DIAGNOSIS — I4891 Unspecified atrial fibrillation: Secondary | ICD-10-CM | POA: Diagnosis not present

## 2019-01-12 DIAGNOSIS — K922 Gastrointestinal hemorrhage, unspecified: Secondary | ICD-10-CM | POA: Insufficient documentation

## 2019-01-12 DIAGNOSIS — R600 Localized edema: Secondary | ICD-10-CM

## 2019-01-12 LAB — BASIC METABOLIC PANEL
Anion gap: 7 (ref 5–15)
BUN: 27 mg/dL — ABNORMAL HIGH (ref 8–23)
CALCIUM: 8.1 mg/dL — AB (ref 8.9–10.3)
CHLORIDE: 93 mmol/L — AB (ref 98–111)
CO2: 37 mmol/L — ABNORMAL HIGH (ref 22–32)
Creatinine, Ser: 0.87 mg/dL (ref 0.44–1.00)
GFR calc Af Amer: >60 mL/min (ref >60)
GFR calc non Af Amer: >60 mL/min (ref >60)
Glucose, Bld: 81 mg/dL (ref 70–99)
Potassium: 4.1 mmol/L (ref 3.5–5.1)
SODIUM: 137 mmol/L (ref 135–145)

## 2019-01-12 LAB — CBC WITH DIFFERENTIAL/PLATELET
Abs Immature Granulocytes: 0.02 10*3/uL (ref 0.00–0.07)
Basophils Absolute: 0.1 10*3/uL (ref 0.0–0.1)
Basophils Relative: 1 %
Eosinophils Absolute: 0.8 10*3/uL — ABNORMAL HIGH (ref 0.0–0.5)
Eosinophils Relative: 10 %
HCT: 26.4 % — ABNORMAL LOW (ref 36.0–46.0)
Hemoglobin: 8 g/dL — ABNORMAL LOW (ref 12.0–15.0)
Immature Granulocytes: 0 %
Lymphocytes Relative: 23 %
Lymphs Abs: 1.8 10*3/uL (ref 0.7–4.0)
MCH: 29.6 pg (ref 26.0–34.0)
MCHC: 30.3 g/dL (ref 30.0–36.0)
MCV: 97.8 fL (ref 80.0–100.0)
Monocytes Absolute: 0.7 10*3/uL (ref 0.1–1.0)
Monocytes Relative: 9 %
Neutro Abs: 4.6 10*3/uL (ref 1.7–7.7)
Neutrophils Relative %: 57 %
Platelets: 240 10*3/uL (ref 150–400)
RBC: 2.7 MIL/uL — ABNORMAL LOW (ref 3.87–5.11)
RDW: 17.2 % — ABNORMAL HIGH (ref 11.5–15.5)
WBC: 8 10*3/uL (ref 4.0–10.5)
nRBC: 0 % (ref 0.0–0.2)

## 2019-01-12 LAB — IRON AND TIBC
Iron: 33 ug/dL (ref 28–170)
SATURATION RATIOS: 13 % (ref 10.4–31.8)
TIBC: 250 ug/dL (ref 250–450)
UIBC: 217 ug/dL

## 2019-01-12 NOTE — Progress Notes (Signed)
Location:  [facility] Room Number: 102 P Place of Service:  SNF [phone]) Provider:  Einar Crow, MD  Mahlon Gammon, MD  Patient Care Team: Mahlon Gammon, MD as PCP - General (Internal Medicine)  Extended Emergency Contact Information Primary Emergency Contact: [facility] Address: [address]          [place], [place] [address] [place] Work Phone: [phone] Relation: Other  Code Status:  Full Code Goals of care: Advanced Directive information Advanced Directives 01/12/2019  Does Patient Have a Medical Advance Directive? No  Type of Advance Directive -  Does patient want to make changes to medical advance directive? No - Patient declined  Would patient like information on creating a medical advance directive? No - Patient declined     Chief Complaint  Patient presents with  . Acute Visit    GI Bleed    HPI:  Pt is a 85 y.o. female seen today for an acute visit for Positive Stool for blood And LE swelling She is here for Therapy.  Patient was admitted in [facility] [place] to 3/63forright parapneumonic effusion, A. fib new onset A. fib with RVR, and hyponatremia.  Patient has past medical history of COPD on chronic oxygen, hyponatremia, hypertension and recent herpes zoster in 01/20  Patient has been having Black stools since she has been here . She was started on iron but her stools are positive for blood. They have stopped since yesterday. We also stopped her Eliquis yesterday. She also continues to have  Swelling in her LE She was started on lasix 20 mg but there has not been any improvement She has not lost any weight either She also continues to leak from her Legs and unable to do therapy due to swelling.  She denies any chest pain, cough or SOB They are also planning to discharge her home end of this week.  Per patient she is still wheelchair dependent and unable to walk with her  walker.But is planning to go with her son     Past Medical History:  Diagnosis Date  . Allergy   . Anxiety   . Asthma   . Atrial fibrillation with RVR (HCC)   . Bronchiectasis (HCC)   . COPD (chronic obstructive pulmonary disease) (HCC)   . Hypertension   . On home oxygen therapy   . Severe protein-calorie malnutrition (HCC)   . Shingles    Jan 2020; on back   . SVT (supraventricular tachycardia) (HCC)    Past Surgical History:  Procedure Laterality Date  . APPENDECTOMY    . BREAST SURGERY    . CATARACT EXTRACTION    . PILONIDAL CYST / SINUS EXCISION      Allergies  Allergen Reactions  . Vibramycin [Doxycycline Calcium]     Outpatient Encounter Medications as of 01/12/2019  Medication Sig  . acetaminophen (TYLENOL) 500 MG tablet Take 500 mg by mouth every 6 (six) hours as needed.  Marland Kitchen albuterol (PROVENTIL) (2.5 MG/3ML) 0.083% nebulizer solution Take 2.5 mg by nebulization every 4 (four) hours as needed for wheezing or shortness of breath.  . ALPRAZolam (XANAX) 0.25 MG tablet Take 0.125 mg by mouth at bedtime as needed for anxiety.   Marland Kitchen amiodarone (PACERONE) 200 MG tablet Take 200 mg by mouth daily. Beginning 01/07/2019  . bisacodyl (FLEET) 10 MG/30ML ENEM Place 10 mg rectally daily as needed.  . cholecalciferol (VITAMIN D3) 25 MCG (1000  UT) tablet Take 1,000 Units by mouth daily.  . clotrimazole-betamethasone (LOTRISONE) cream Apply 1 application topically 2 (two) times daily. To buttocks  . Cyanocobalamin (VITAMIN B 12) 100 MCG LOZG Take 1 tablet by mouth daily.  . ferrous sulfate 325 (65 FE) MG tablet Take 325 mg by mouth daily.  . furosemide (LASIX) 20 MG tablet Take 20 mg by mouth daily.  Marland Kitchen gabapentin (NEURONTIN) 100 MG capsule Take 100 mg by mouth 2 (two) times daily.  . metoprolol tartrate (LOPRESSOR) 25 MG tablet Take 12.5 mg by mouth 2 (two) times daily.  . NON FORMULARY Diet Type:  Downgraded to grounded meat per therapy until speech therapy does evaluation next  week  . Nutritional Supplements (ENSURE ENLIVE PO) Take 1 Bottle by mouth 2 (two) times daily between meals.  Sherren Mocha Supplies (SKIN PREP WIPES) MISC Apply topically to bilateral heels every shift  . OXYGEN Inhale 2 L/min into the lungs continuous.  . pantoprazole (PROTONIX) 40 MG tablet Take 40 mg by mouth daily.  . potassium chloride SA (K-DUR,KLOR-CON) 20 MEQ tablet Take 20 mEq by mouth daily.  . [DISCONTINUED] apixaban (ELIQUIS) 2.5 MG TABS tablet Take 2.5 mg by mouth 2 (two) times daily.   No facility-administered encounter medications on file as of 01/12/2019.     Review of Systems  Constitutional: Positive for activity change.  HENT: Negative.   Respiratory: Negative.   Cardiovascular: Positive for leg swelling.  Gastrointestinal: Positive for blood in stool.  Genitourinary: Negative.   Musculoskeletal: Negative.   Neurological: Negative.   Psychiatric/Behavioral: Negative.      There is no immunization history on file for this patient. Pertinent  Health Maintenance Due  Topic Date Due  . INFLUENZA VACCINE  01/13/2019 (Originally 05/15/2018)  . DEXA SCAN  02/05/2019 (Originally 01/27/1998)  . PNA vac Low Risk Adult (1 of 2 - PCV13) 02/05/2019 (Originally 01/27/1998)   No flowsheet data found. Functional Status Survey:    Vitals:   01/12/19 0954  BP: (!) 119/55  Pulse: 63  Resp: 17  Temp: (!) 96.5 F (35.8 C)  SpO2: 94%  Weight: 105 lb 12.8 oz (48 kg)  Height:  (1.676 m)   Body mass index is 17.08 kg/m. Physical Exam Vitals signs reviewed.  Constitutional:      Appearance: Normal appearance.  HENT:     Head: Normocephalic.     Nose: Nose normal.     Mouth/Throat:     Mouth: Mucous membranes are moist.     Pharynx: Oropharynx is clear.  Eyes:     Pupils: Pupils are equal, round, and reactive to light.  Neck:     Musculoskeletal: Neck supple.  Cardiovascular:     Rate and Rhythm: Normal rate. Rhythm irregular.     Pulses: Normal pulses.   Pulmonary:     Effort: Pulmonary effort is normal.     Comments: Had Crackles in left Lung Right had decreased BS Abdominal:     General: Abdomen is flat. Bowel sounds are normal.     Palpations: Abdomen is soft.  Musculoskeletal:     Comments: Has Mod Edema Bilateral with petechiae   Skin:    General: Skin is warm.     Comments: Healed Zoster rash on her Right Back  Neurological:     General: No focal deficit present.     Mental Status: She is alert and oriented to person, place, and time.  Psychiatric:        Mood and Affect:  Mood normal.        Thought Content: Thought content normal.        Judgment: Judgment normal.     Labs reviewed: Recent Labs    01/06/19 0600 01/12/19 0700  NA 138 137  K 4.4 4.1  CL 92* 93*  CO2 40* 37*  GLUCOSE 82 81  BUN 28* 27*  CREATININE 0.85 0.87  CALCIUM 8.1* 8.1*  MG 1.9  --    Recent Labs    01/06/19 0600  AST 29  ALT 18  ALKPHOS 83  BILITOT 0.4  PROT 5.4*  ALBUMIN 2.3*   Recent Labs    01/06/19 0600 01/12/19 0700  WBC 8.0 8.0  NEUTROABS 5.3 4.6  HGB 8.8* 8.0*  HCT 29.1* 26.4*  MCV 96.4 97.8  PLT 268 240   No results found for: TSH No results found for: HGBA1C No results found for: CHOL, HDL, LDLCALC, LDLDIRECT, TRIG, CHOLHDL  Significant Diagnostic Results in last 30 days:  No results found.  Assessment/Plan GI bleed Have made her urgent referral to GI Stopped Eliquis for now Repeat Hgb is 8 Continue on iron.  Continue on Protonix If she restart having Black tarry Guaiac stools will send to ED Repeat CBC in few days LE Edema Will start her on Toresimide Will continue lasix also Repeat BMP in few days Follow for Hyponatremia  Other issues are stable COPD On Chronic Oxygen but not on anyinhalers  Hyponatremia Was normal today but will follow closely on Lasix and Toresimide  S/Pchest tube placementfor Right Parapnuemonic Effusion/Empyemea Finished antibiotics for 2 weeks in hospital  Continue to monitor Clinically  Severe protein-calorie malnutrition Talked to Dietician for Supplements Atrial fibrillation with RVR In sinus rhythm  Eliquis was discontinued due to GI bleed On Amiodarone Will need follow up with Cardiology  Family/ staff Communication:   Labs/tests ordered:  CBC and BMP  Total time spent in this patient care encounter was 25_ minutes; greater than 50% of the visit spent counseling patient, reviewing records , Labs and coordinating care for problems addressed at this encounter.

## 2019-01-14 MED ORDER — NITROGLYCERIN 0.4 MG SL SUBL
0.40 | SUBLINGUAL_TABLET | SUBLINGUAL | Status: DC
Start: ? — End: 2019-01-14

## 2019-01-14 MED ORDER — FLUMAZENIL 0.5 MG/5ML IV SOLN
.20 | INTRAVENOUS | Status: DC
Start: ? — End: 2019-01-14

## 2019-01-14 MED ORDER — SODIUM CHLORIDE 0.9 % IV SOLN
10.00 | INTRAVENOUS | Status: DC
Start: ? — End: 2019-01-14

## 2019-01-14 MED ORDER — GENERIC EXTERNAL MEDICATION
0.04 | Status: DC
Start: ? — End: 2019-01-14

## 2019-01-14 MED ORDER — CLOTRIMAZOLE 1 % EX CREA
TOPICAL_CREAM | CUTANEOUS | Status: DC
Start: 2019-01-02 — End: 2019-01-14

## 2019-01-14 MED ORDER — MORPHINE SULFATE (PF) 2 MG/ML IV SOLN
1.00 | INTRAVENOUS | Status: DC
Start: ? — End: 2019-01-14

## 2019-01-14 MED ORDER — APIXABAN 2.5 MG PO TABS
2.50 | ORAL_TABLET | ORAL | Status: DC
Start: 2019-01-02 — End: 2019-01-14

## 2019-01-14 MED ORDER — GENERIC EXTERNAL MEDICATION
Status: DC
Start: ? — End: 2019-01-14

## 2019-01-14 MED ORDER — GABAPENTIN 100 MG PO CAPS
100.00 | ORAL_CAPSULE | ORAL | Status: DC
Start: 2019-01-02 — End: 2019-01-14

## 2019-01-14 MED ORDER — METOPROLOL TARTRATE 25 MG PO TABS
12.50 | ORAL_TABLET | ORAL | Status: DC
Start: 2019-01-02 — End: 2019-01-14

## 2019-01-14 MED ORDER — ALBUTEROL SULFATE (2.5 MG/3ML) 0.083% IN NEBU
2.50 | INHALATION_SOLUTION | RESPIRATORY_TRACT | Status: DC
Start: ? — End: 2019-01-14

## 2019-01-14 MED ORDER — ALUM & MAG HYDROXIDE-SIMETH 200-200-20 MG/5ML PO SUSP
30.00 | ORAL | Status: DC
Start: ? — End: 2019-01-14

## 2019-01-14 MED ORDER — GENERIC EXTERNAL MEDICATION
1.00 | Status: DC
Start: 2019-01-03 — End: 2019-01-14

## 2019-01-14 MED ORDER — IPRATROPIUM-ALBUTEROL 0.5-2.5 (3) MG/3ML IN SOLN
3.00 | RESPIRATORY_TRACT | Status: DC
Start: 2019-01-02 — End: 2019-01-14

## 2019-01-14 MED ORDER — ACETAMINOPHEN 325 MG PO TABS
650.00 | ORAL_TABLET | ORAL | Status: DC
Start: ? — End: 2019-01-14

## 2019-01-14 MED ORDER — SENNA-DOCUSATE SODIUM 8.6-50 MG PO TABS
1.00 | ORAL_TABLET | ORAL | Status: DC
Start: ? — End: 2019-01-14

## 2019-01-14 MED ORDER — ALPRAZOLAM 0.25 MG PO TABS
.13 | ORAL_TABLET | ORAL | Status: DC
Start: ? — End: 2019-01-14

## 2019-01-15 ENCOUNTER — Non-Acute Institutional Stay (SKILLED_NURSING_FACILITY): Payer: Medicare HMO | Admitting: Internal Medicine

## 2019-01-15 ENCOUNTER — Encounter: Payer: Self-pay | Admitting: Internal Medicine

## 2019-01-15 ENCOUNTER — Ambulatory Visit (HOSPITAL_COMMUNITY)
Admission: RE | Admit: 2019-01-15 | Discharge: 2019-01-15 | Disposition: A | Discharge status: Home / Self Care | Payer: Medicare HMO | Source: Ambulatory Visit | Attending: Internal Medicine | Admitting: Internal Medicine

## 2019-01-15 ENCOUNTER — Encounter (HOSPITAL_COMMUNITY)
Admission: RE | Admit: 2019-01-15 | Discharge: 2019-01-15 | Disposition: A | Discharge status: Home / Self Care | Payer: Medicare HMO | Source: Skilled Nursing Facility | Attending: Internal Medicine | Admitting: Internal Medicine

## 2019-01-15 DIAGNOSIS — J439 Emphysema, unspecified: Secondary | ICD-10-CM

## 2019-01-15 DIAGNOSIS — K921 Melena: Secondary | ICD-10-CM | POA: Insufficient documentation

## 2019-01-15 DIAGNOSIS — R600 Localized edema: Secondary | ICD-10-CM | POA: Insufficient documentation

## 2019-01-15 DIAGNOSIS — I4891 Unspecified atrial fibrillation: Secondary | ICD-10-CM | POA: Insufficient documentation

## 2019-01-15 DIAGNOSIS — I10 Essential (primary) hypertension: Secondary | ICD-10-CM | POA: Diagnosis not present

## 2019-01-15 DIAGNOSIS — J90 Pleural effusion, not elsewhere classified: Secondary | ICD-10-CM | POA: Insufficient documentation

## 2019-01-15 DIAGNOSIS — E871 Hypo-osmolality and hyponatremia: Secondary | ICD-10-CM | POA: Insufficient documentation

## 2019-01-15 DIAGNOSIS — J189 Pneumonia, unspecified organism: Secondary | ICD-10-CM | POA: Insufficient documentation

## 2019-01-15 DIAGNOSIS — J449 Chronic obstructive pulmonary disease, unspecified: Secondary | ICD-10-CM | POA: Insufficient documentation

## 2019-01-15 DIAGNOSIS — Z741 Need for assistance with personal care: Secondary | ICD-10-CM | POA: Insufficient documentation

## 2019-01-15 LAB — BASIC METABOLIC PANEL
Anion gap: 9 (ref 5–15)
BUN: 29 mg/dL — ABNORMAL HIGH (ref 8–23)
CO2: 40 mmol/L — ABNORMAL HIGH (ref 22–32)
Calcium: 8.5 mg/dL — ABNORMAL LOW (ref 8.9–10.3)
Chloride: 89 mmol/L — ABNORMAL LOW (ref 98–111)
Creatinine, Ser: 1 mg/dL (ref 0.44–1.00)
GFR calc Af Amer: 59 mL/min — ABNORMAL LOW (ref >60)
GFR calc non Af Amer: 51 mL/min — ABNORMAL LOW (ref >60)
Glucose, Bld: 75 mg/dL (ref 70–99)
Potassium: 3.8 mmol/L (ref 3.5–5.1)
Sodium: 138 mmol/L (ref 135–145)

## 2019-01-15 LAB — CBC WITH DIFFERENTIAL/PLATELET
Abs Immature Granulocytes: 0.01 10*3/uL (ref 0.00–0.07)
Basophils Absolute: 0.1 10*3/uL (ref 0.0–0.1)
Basophils Relative: 1 %
Eosinophils Absolute: 0.1 10*3/uL (ref 0.0–0.5)
Eosinophils Relative: 1 %
HCT: 27.9 % — ABNORMAL LOW (ref 36.0–46.0)
Hemoglobin: 8.9 g/dL — ABNORMAL LOW (ref 12.0–15.0)
Immature Granulocytes: 0 %
Lymphocytes Relative: 25 %
Lymphs Abs: 1.4 10*3/uL (ref 0.7–4.0)
MCH: 31.7 pg (ref 26.0–34.0)
MCHC: 31.9 g/dL (ref 30.0–36.0)
MCV: 99.3 fL (ref 80.0–100.0)
Monocytes Absolute: 0.4 10*3/uL (ref 0.1–1.0)
Monocytes Relative: 7 %
Neutro Abs: 3.8 10*3/uL (ref 1.7–7.7)
Neutrophils Relative %: 66 %
Platelets: 300 10*3/uL (ref 150–400)
RBC: 2.81 MIL/uL — ABNORMAL LOW (ref 3.87–5.11)
RDW: 17.8 % — ABNORMAL HIGH (ref 11.5–15.5)
WBC: 5.7 10*3/uL (ref 4.0–10.5)
nRBC: 0 % (ref 0.0–0.2)

## 2019-01-15 MED ORDER — GENERIC EXTERNAL MEDICATION
Status: DC
Start: ? — End: 2019-01-15

## 2019-01-15 NOTE — Progress Notes (Signed)
Location:  [facility] Room Number: 102 P Place of Service:  SNF [phone]) Provider:  Einar Crow, MD  Mahlon Gammon, MD  Patient Care Team: Mahlon Gammon, MD as PCP - General (Internal Medicine)  Extended Emergency Contact Information Primary Emergency Contact: [facility] Address: [address]          [place], [place] [address] [place] Work Phone: [phone] Relation: Other  Code Status:  Full Code Goals of care: Advanced Directive information Advanced Directives 01/15/2019  Does Patient Have a Medical Advance Directive? No  Type of Advance Directive -  Does patient want to make changes to medical advance directive? No - Patient declined  Would patient like information on creating a medical advance directive? No - Patient declined     Chief Complaint  Patient presents with  . Acute Visit    Anemia, Edema    HPI:  Pt is a 85 y.o. female seen today for an acute visit for Follow up of her Anemia and Edema  Patient is in SNF for therapy. Patient was admitted in [place] medical [place] 3[place] to 3/24 for right parapneumonic effusion, A. fib new onset A. fib with RVR, and hyponatremia. Patient has past medical history of COPD on chronic oxygen, hyponatremia, hypertension and recent herpes zoster in 01 /20  Patient was admitted from her cardiologist office for severe shortness of breath and exertion.  She states that her health has been going downhill since she was diagnosed with shingles on her back in January.  In ED she was found to have a moderate-sized multifocal sub-pulmonic right pleural effusion.  Eventually she underwent chest tube placement.  Her fluid grew strep intermedius and she was treated with 2 weeks of ceftriaxone.  She also had left-sided pleural effusion but the fluid was transudate   Patient also developed atrial fibrillation with RVR.  It did not get control with diltiazem and she was started on  IV amiodarone loading dose.  She was also started on Eliquis. Patient also has a history of hyponatremia which got worsened in the hospital at 126.  Eventually she was treated with Lasix. For her right-sided pain Neurontin was added which helped Her EF was normal. Since being in the facility patient developed moderate lower extremity edema.  She also was having black tarry stools which were guaiac positive. Her hemoglobin slowly drifted down from 9.9 in the hospital to 8.  We started her on Protonix.  On iron for her edema she was started on Lasix and then torsemide was added Patient is doing better today.  She was planning to go home tomorrow with her son but we are holding her discharge till next week follow these active problems. She did have a cough today and she was mildly short of breath. She has not had any Black tarry stools for past 1 day. She is working with therapy and is able to walk with her walker She continues to be very weak   Past Medical History:  Diagnosis Date  . Allergy   . Anxiety   . Asthma   . Atrial fibrillation with RVR (HCC)   . Bronchiectasis (HCC)   . COPD (chronic obstructive pulmonary disease) (HCC)   . Hypertension   . On home oxygen therapy   . Severe protein-calorie malnutrition (HCC)   . Shingles    Jan 2020; on back   . SVT (supraventricular tachycardia) (HCC)  Past Surgical History:  Procedure Laterality Date  . APPENDECTOMY    . BREAST SURGERY    . CATARACT EXTRACTION    . PILONIDAL CYST / SINUS EXCISION      Allergies  Allergen Reactions  . Vibramycin [Doxycycline Calcium]     Outpatient Encounter Medications as of 01/15/2019  Medication Sig  . acetaminophen (TYLENOL) 500 MG tablet Take 500 mg by mouth every 6 (six) hours as needed.  Marland Kitchen. albuterol (PROVENTIL) (2.5 MG/3ML) 0.083% nebulizer solution Take 2.5 mg by nebulization every 4 (four) hours as needed for wheezing or shortness of breath.  . ALPRAZolam (XANAX) 0.25 MG tablet Take  0.125 mg by mouth at bedtime as needed for anxiety.   Marland Kitchen. amiodarone (PACERONE) 200 MG tablet Take 200 mg by mouth daily.   . bisacodyl (FLEET) 10 MG/30ML ENEM Place 10 mg rectally daily as needed.  . cholecalciferol (VITAMIN D3) 25 MCG (1000 UT) tablet Take 1,000 Units by mouth daily.  . clotrimazole-betamethasone (LOTRISONE) cream Apply 1 application topically 2 (two) times daily. To buttocks  . Cyanocobalamin (VITAMIN B 12) 100 MCG LOZG Take 1 tablet by mouth daily.  . ferrous sulfate 325 (65 FE) MG tablet Take 325 mg by mouth daily.  . furosemide (LASIX) 20 MG tablet Take 20 mg by mouth daily.  Marland Kitchen. gabapentin (NEURONTIN) 100 MG capsule Take 100 mg by mouth 2 (two) times daily.  . metoprolol tartrate (LOPRESSOR) 25 MG tablet Take 12.5 mg by mouth 2 (two) times daily.  . NON FORMULARY Diet Type:  Downgraded to grounded meat per therapy until speech therapy does evaluation next week  . Nutritional Supplements (ENSURE ENLIVE PO) Take 1 Bottle by mouth 2 (two) times daily between meals.  Sherren Mocha. Ostomy Supplies (SKIN PREP WIPES) MISC Apply topically to bilateral heels every shift  . OXYGEN Inhale 2 L/min into the lungs continuous.  . pantoprazole (PROTONIX) 40 MG tablet Take 40 mg by mouth daily.  . potassium chloride SA (K-DUR,KLOR-CON) 20 MEQ tablet Take 20 mEq by mouth daily.  Marland Kitchen. torsemide (DEMADEX) 20 MG tablet Take 20 mg by mouth daily.   No facility-administered encounter medications on file as of 01/15/2019.     Review of Systems  Constitutional: Positive for activity change.  HENT: Negative.   Respiratory: Negative.   Cardiovascular: Positive for leg swelling.  Gastrointestinal: Positive for blood in stool.  Genitourinary: Negative.   Musculoskeletal: Negative.   Skin: Negative.   Neurological: Positive for weakness.  Psychiatric/Behavioral: Negative.     Immunization History  Administered Date(s) Administered  . Influenza Split 08/09/2011, 07/27/2014, 07/15/2016, 07/15/2017  .  Influenza, High Dose Seasonal PF 09/03/2017, 07/07/2018  . Influenza-Unspecified 08/04/2012, 08/07/2013  . Pneumococcal Polysaccharide-23 07/15/2009   Pertinent  Health Maintenance Due  Topic Date Due  . DEXA SCAN  02/05/2019 (Originally 01/27/1998)  . PNA vac Low Risk Adult (1 of 2 - PCV13) 02/05/2019 (Originally 01/27/1998)  . INFLUENZA VACCINE  05/16/2019   No flowsheet data found. Functional Status Survey:    Vitals:   01/15/19 1107  BP: (!) 147/66  Pulse: (!) 59  Resp: 19  Temp: (!) 96.5 F (35.8 C)  SpO2: 98%  Weight: 100 lb 9.6 oz (45.6 kg)  Height: 5\' 6"  (1.676 m)   Body mass index is 16.24 kg/m. Physical Exam Vitals signs reviewed.  Constitutional:      Appearance: Normal appearance.  HENT:     Head: Normocephalic.     Nose: Nose normal.     Mouth/Throat:  Mouth: Mucous membranes are moist.     Pharynx: Oropharynx is clear.  Eyes:     Pupils: Pupils are equal, round, and reactive to light.  Neck:     Musculoskeletal: Neck supple.  Cardiovascular:     Rate and Rhythm: Normal rate. Rhythm irregular.     Pulses: Normal pulses.  Pulmonary:     Effort: Pulmonary effort is normal.     Comments: Had Crackles in left Lung Right had decreased BS Abdominal:     General: Abdomen is flat. Bowel sounds are normal.     Palpations: Abdomen is soft.  Musculoskeletal:     Comments: Has Mod Edema Bilateral with petechiae but her edema is better today  Skin:    General: Skin is warm.     Comments: Healed Zoster rash on her Right Back  Neurological:     General: No focal deficit present.     Mental Status: She is alert and oriented to person, place, and time.  Psychiatric:        Mood and Affect: Mood normal.        Thought Content: Thought content normal.        Judgment: Judgment normal.     Labs reviewed: Recent Labs    01/06/19 0600 01/12/19 0700 01/15/19 0615  NA 138 137 138  K 4.4 4.1 3.8  CL 92* 93* 89*  CO2 40* 37* 40*  GLUCOSE 82 81 75  BUN  28* 27* 29*  CREATININE 0.85 0.87 1.00  CALCIUM 8.1* 8.1* 8.5*  MG 1.9  --   --    Recent Labs    01/06/19 0600  AST 29  ALT 18  ALKPHOS 83  BILITOT 0.4  PROT 5.4*  ALBUMIN 2.3*   Recent Labs    01/06/19 0600 01/12/19 0700 01/15/19 0615  WBC 8.0 8.0 5.7  NEUTROABS 5.3 4.6 3.8  HGB 8.8* 8.0* 8.9*  HCT 29.1* 26.4* 27.9*  MCV 96.4 97.8 99.3  PLT 268 240 300   No results found for: TSH No results found for: HGBA1C No results found for: CHOL, HDL, LDLCALC, LDLDIRECT, TRIG, CHOLHDL  Significant Diagnostic Results in last 30 days:  No results found.  Assessment/Plan GI bleed Have made her urgent referral to GI waiting to hear from them Stopped Eliquis for now Repeat Hgb has come up to 8.8 Continue on iron.  Continue on Protonix If she restart having Black tarry Guaiac stools will send to ED Repeat CBC in few days LE Edema On Both Torsemide and Lasix and finally she has lost 5 lbs and her edema is better I am still worried about hyponatremia We are watching her sodium closely  COPD On Chronic Oxygen but not on anyinhalers She is coughing today No Wheezing on Exam Will get Chest Xray to follow her CHF and Effusion Also Started her on Spiriva  Hyponatremia Was normal today but will follow closely on Lasix and Toresimide  S/Pchest tube placementfor Right Parapnuemonic Effusion/Empyemea Finished antibiotics for 2 weeks in hospital Will getting Repeat Chest Xray  Severe protein-calorie malnutrition Talked to Dietician for Supplements Atrial fibrillation with RVR In sinus rhythm  Eliquis was discontinued due to GI bleed On Amiodarone Will need follow up with Cardiology on discharge     Family/ staff Communication:  Labs/tests ordered:  Chest XRAY Repeat BMP and CBC in few days  Total time spent in this patient care encounter was _45 minutes; greater than 50% of the visit spent counseling patient, reviewing records ,  Labs and coordinating  care for problems addressed at this encounter.

## 2019-01-17 ENCOUNTER — Other Ambulatory Visit: Payer: Self-pay | Admitting: Internal Medicine

## 2019-01-17 MED ORDER — ALPRAZOLAM 0.25 MG PO TABS: 0.1250 mg | ORAL_TABLET | Freq: Every evening | ORAL | 0 refills | Status: AC | PRN

## 2019-01-19 ENCOUNTER — Non-Acute Institutional Stay (SKILLED_NURSING_FACILITY): Payer: Medicare HMO | Admitting: Internal Medicine

## 2019-01-19 ENCOUNTER — Encounter: Payer: Self-pay | Admitting: Internal Medicine

## 2019-01-19 ENCOUNTER — Encounter (HOSPITAL_COMMUNITY)
Admission: RE | Admit: 2019-01-19 | Discharge: 2019-01-19 | Disposition: A | Discharge status: Home / Self Care | Payer: Medicare HMO | Source: Skilled Nursing Facility | Attending: *Deleted | Admitting: *Deleted

## 2019-01-19 DIAGNOSIS — J439 Emphysema, unspecified: Secondary | ICD-10-CM

## 2019-01-19 DIAGNOSIS — I480 Paroxysmal atrial fibrillation: Secondary | ICD-10-CM | POA: Diagnosis not present

## 2019-01-19 DIAGNOSIS — R600 Localized edema: Secondary | ICD-10-CM

## 2019-01-19 DIAGNOSIS — E871 Hypo-osmolality and hyponatremia: Secondary | ICD-10-CM

## 2019-01-19 DIAGNOSIS — K921 Melena: Secondary | ICD-10-CM | POA: Diagnosis not present

## 2019-01-19 LAB — COMPREHENSIVE METABOLIC PANEL
ALT: 17 U/L (ref 0–44)
AST: 26 U/L (ref 15–41)
Albumin: 2.7 g/dL — ABNORMAL LOW (ref 3.5–5.0)
Alkaline Phosphatase: 68 U/L (ref 38–126)
Anion gap: 8 (ref 5–15)
BUN: 31 mg/dL — ABNORMAL HIGH (ref 8–23)
CO2: 41 mmol/L — ABNORMAL HIGH (ref 22–32)
Calcium: 8.5 mg/dL — ABNORMAL LOW (ref 8.9–10.3)
Chloride: 90 mmol/L — ABNORMAL LOW (ref 98–111)
Creatinine, Ser: 1.09 mg/dL — ABNORMAL HIGH (ref 0.44–1.00)
GFR calc Af Amer: 54 mL/min — ABNORMAL LOW (ref >60)
GFR calc non Af Amer: 46 mL/min — ABNORMAL LOW (ref >60)
Glucose, Bld: 84 mg/dL (ref 70–99)
Potassium: 3.9 mmol/L (ref 3.5–5.1)
Sodium: 139 mmol/L (ref 135–145)
Total Bilirubin: 0.3 mg/dL (ref 0.3–1.2)
Total Protein: 6.1 g/dL — ABNORMAL LOW (ref 6.5–8.1)

## 2019-01-19 LAB — CBC WITH DIFFERENTIAL/PLATELET
Abs Immature Granulocytes: 0.02 10*3/uL (ref 0.00–0.07)
Basophils Absolute: 0 10*3/uL (ref 0.0–0.1)
Basophils Relative: 1 %
Eosinophils Absolute: 0.1 10*3/uL (ref 0.0–0.5)
Eosinophils Relative: 2 %
HCT: 25.9 % — ABNORMAL LOW (ref 36.0–46.0)
Hemoglobin: 8.6 g/dL — ABNORMAL LOW (ref 12.0–15.0)
Immature Granulocytes: 0 %
Lymphocytes Relative: 25 %
Lymphs Abs: 1.4 10*3/uL (ref 0.7–4.0)
MCH: 33.9 pg (ref 26.0–34.0)
MCHC: 33.2 g/dL (ref 30.0–36.0)
MCV: 102 fL — ABNORMAL HIGH (ref 80.0–100.0)
Monocytes Absolute: 0.5 10*3/uL (ref 0.1–1.0)
Monocytes Relative: 10 %
Neutro Abs: 3.4 10*3/uL (ref 1.7–7.7)
Neutrophils Relative %: 62 %
Platelets: 255 10*3/uL (ref 150–400)
RBC: 2.54 MIL/uL — ABNORMAL LOW (ref 3.87–5.11)
RDW: 18.9 % — ABNORMAL HIGH (ref 11.5–15.5)
WBC: 5.5 10*3/uL (ref 4.0–10.5)
nRBC: 0 % (ref 0.0–0.2)

## 2019-01-19 NOTE — Progress Notes (Signed)
Location:  [facility] Room Number: 102 P Place of Service:  SNF [phone](31) Provider:  Einar CrowAnjali Gupta, MD  [name], [name], MD  Patient Care Team: [name], [name], MD as PCP - General (Internal Medicine)  Extended Emergency Contact Information Primary Emergency Contact: [facility] Address: [address]          [place], [place] [address] [place] Work Phone: [phone] Relation: Other  Code Status:  Full Code Goals of care: Advanced Directive information Advanced Directives 01/19/2019  Does Patient Have a Medical Advance Directive? No  Type of Advance Directive -  Does patient want to make changes to medical advance directive? No - Patient declined  Would patient like information on creating a medical advance directive? No - Patient declined     Chief Complaint  Patient presents with  . Acute Visit    follow up/Discharge Planning    HPI:  Pt is a 85 y.o. female seen today for an acute visit for Possible discharge in few days . Patient was in SNF for therapy.  [facility] [place] [facility] [place] [facility] parapneumonic effusion, A. fib new onset A. fib with RVR, and hyponatremia. Patient has past medical history of COPD on chronic oxygen, hyponatremia, hypertension and recent herpes zoster in 01/20  Patient was admitted from her cardiologist office for severe shortness of breath and exertion. She states that her health has been going downhill since she was diagnosed with shingles on her back in January. In ED she was found to have a moderate-sized multifocal sub-pulmonic right pleural effusion. Eventually she underwent chest tube placement. Her fluid grew strep intermedius and she was treated with 2 weeks of ceftriaxone. She also had left-sided pleural effusion but the fluid was transudate   Patient also developed atrial fibrillation with RVR. It did not get control with diltiazem and she  was started on IV amiodarone loading dose. She was also started on Eliquis. Patient also has a history of hyponatremia which got worsened in the hospital at 126. Eventually she was treated with Lasix. For her right-sided pain Neurontin was added which helped Her EF was normal. In th facility she developed Severe LE edema and needed aggressive diuresis. She also developed Melena and was started on Protonix and her Eliquis was stopped. Her HGb which dropped from 10 to 8 did stabilize at 8.6. But she continues to have c/o Occult positive stool. She  had Cough with Productive sputum and SOB today . Her chest Xray done  few days ago was clear with resolution of her Effusion . Patient is doing well with therapy and is planning to go home with her son in 2 days  Past Medical History:  Diagnosis Date  . Allergy   . Anxiety   . Asthma   . Atrial fibrillation with RVR (HCC)   . Bronchiectasis (HCC)   . COPD (chronic obstructive pulmonary disease) (HCC)   . Hypertension   . On home oxygen therapy   . Severe protein-calorie malnutrition (HCC)   . Shingles    Jan 2020; on back   . SVT (supraventricular tachycardia) (HCC)    Past Surgical History:  Procedure Laterality Date  . APPENDECTOMY    . BREAST SURGERY    . CATARACT EXTRACTION    . PILONIDAL CYST / SINUS EXCISION      Allergies  Allergen Reactions  . Vibramycin [Doxycycline Calcium]     Outpatient  Encounter Medications as of 01/19/2019  Medication Sig  . acetaminophen (TYLENOL) 500 MG tablet Take 500 mg by mouth every 6 (six) hours as needed.  Marland Kitchen albuterol (PROVENTIL) (2.5 MG/3ML) 0.083% nebulizer solution Take 2.5 mg by nebulization every 4 (four) hours as needed for wheezing or shortness of breath.  . ALPRAZolam (XANAX) 0.25 MG tablet Take 0.5 tablets (0.125 mg total) by mouth at bedtime as needed for up to 14 days for anxiety.  Marland Kitchen amiodarone (PACERONE) 200 MG tablet Take 200 mg by mouth daily.   . bisacodyl (FLEET) 10 MG/30ML  ENEM Place 10 mg rectally daily as needed.  . cholecalciferol (VITAMIN D3) 25 MCG (1000 UT) tablet Take 1,000 Units by mouth daily.  . clotrimazole-betamethasone (LOTRISONE) cream Apply 1 application topically 2 (two) times daily. To buttocks  . Cyanocobalamin (VITAMIN B 12) 100 MCG LOZG Take 1 tablet by mouth daily.  . ferrous sulfate 325 (65 FE) MG tablet Take 325 mg by mouth daily.  . furosemide (LASIX) 20 MG tablet Take 20 mg by mouth daily.  Marland Kitchen gabapentin (NEURONTIN) 100 MG capsule Take 100 mg by mouth 2 (two) times daily.  . Melatonin 5 MG TABS Take 1 tablet by mouth at bedtime as needed.  . metoprolol tartrate (LOPRESSOR) 25 MG tablet Take 12.5 mg by mouth 2 (two) times daily.  . NON FORMULARY Diet Type:  Downgraded to grounded meat per therapy until speech therapy does evaluation next week  . Nutritional Supplements (ENSURE ENLIVE PO) Take 1 Bottle by mouth 2 (two) times daily between meals.  Sherren Mocha Supplies (SKIN PREP WIPES) MISC Apply topically to bilateral heels every shift  . OXYGEN Inhale 2 L/min into the lungs continuous.  . pantoprazole (PROTONIX) 40 MG tablet Take 40 mg by mouth daily.  . potassium chloride SA (K-DUR,KLOR-CON) 20 MEQ tablet Take 20 mEq by mouth daily.  Marland Kitchen tiotropium (SPIRIVA) 18 MCG inhalation capsule Place 18 mcg into inhaler and inhale daily. 2 puffs inhalation daily  . torsemide (DEMADEX) 20 MG tablet Take 20 mg by mouth daily.   No facility-administered encounter medications on file as of 01/19/2019.     Review of Systems  Constitutional: Positive for activity change.  HENT: Negative.   Respiratory: Positive for cough, shortness of breath and wheezing.   Cardiovascular: Positive for leg swelling.  Gastrointestinal: Positive for blood in stool.  Genitourinary: Negative.   Musculoskeletal: Negative.   Skin: Negative.   Neurological: Positive for weakness.  Psychiatric/Behavioral: The patient is nervous/anxious.     Immunization History  Administered  Date(s) Administered  . Influenza Split 08/09/2011, 07/27/2014, 07/15/2016, 07/15/2017  . Influenza, High Dose Seasonal PF 09/03/2017, 07/07/2018  . Influenza-Unspecified 08/04/2012, 08/07/2013  . Pneumococcal Polysaccharide-23 07/15/2009   Pertinent  Health Maintenance Due  Topic Date Due  . DEXA SCAN  02/05/2019 (Originally 01/27/1998)  . PNA vac Low Risk Adult (2 of 2 - PCV13) 02/05/2019 (Originally 07/15/2010)  . INFLUENZA VACCINE  05/16/2019   No flowsheet data found. Functional Status Survey:    Vitals:   01/19/19 1232  BP: (!) 141/66  Pulse: 65  Resp: 16  Temp: (!) 97.5 F (36.4 C)  SpO2: 95%  Weight: 101 lb (45.8 kg)  Height: 5\' 6"  (1.676 m)   Body mass index is 16.3 kg/m. Physical Exam Vitals signs reviewed.  Constitutional:      Appearance: Normal appearance.  HENT:     Head: Normocephalic.     Nose: Nose normal.     Mouth/Throat:  Mouth: Mucous membranes are moist.     Pharynx: Oropharynx is clear.  Eyes:     Pupils: Pupils are equal, round, and reactive to light.  Neck:     Musculoskeletal: Neck supple.  Cardiovascular:     Rate and Rhythm: Normal rate and regular rhythm.     Pulses: Normal pulses.  Pulmonary:     Effort: Pulmonary effort is normal.     Comments: Bilateral Expiratory Wheezing Abdominal:     General: Abdomen is flat. Bowel sounds are normal.     Palpations: Abdomen is soft.  Musculoskeletal:     Comments: Moderate Swelling Bilateral  Skin:    General: Skin is warm and dry.  Neurological:     General: No focal deficit present.     Mental Status: She is alert and oriented to person, place, and time.  Psychiatric:        Mood and Affect: Mood normal.        Thought Content: Thought content normal.     Labs reviewed: Recent Labs    01/06/19 0600 01/12/19 0700 01/15/19 0615 01/19/19 0551  NA 138 137 138 139  K 4.4 4.1 3.8 3.9  CL 92* 93* 89* 90*  CO2 40* 37* 40* 41*  GLUCOSE 82 81 75 84  BUN 28* 27* 29* 31*   CREATININE 0.85 0.87 1.00 1.09*  CALCIUM 8.1* 8.1* 8.5* 8.5*  MG 1.9  --   --   --    Recent Labs    01/06/19 0600 01/19/19 0551  AST 29 26  ALT 18 17  ALKPHOS 83 68  BILITOT 0.4 0.3  PROT 5.4* 6.1*  ALBUMIN 2.3* 2.7*   Recent Labs    01/12/19 0700 01/15/19 0615 01/19/19 0551  WBC 8.0 5.7 5.5  NEUTROABS 4.6 3.8 3.4  HGB 8.0* 8.9* 8.6*  HCT 26.4* 27.9* 25.9*  MCV 97.8 99.3 102.0*  PLT 240 300 255   No results found for: TSH No results found for: HGBA1C No results found for: CHOL, HDL, LDLCALC, LDLDIRECT, TRIG, CHOLHDL  Significant Diagnostic Results in last 30 days:  Dg Chest 2 View  Result Date: 01/15/2019 CLINICAL DATA:  Pleural effusion EXAM: CHEST - 2 VIEW COMPARISON:  None. FINDINGS: Heart size is upper limits of normal. There is aortic atherosclerosis. The lungs are hyperinflated consistent with emphysema. Relative lucency at the apices. Coarsened interstitial markings, at least somewhat likely to be chronic. Probable bronchiectasis at the lung bases. Some nodular component is present. This is nonspecific and could be inflammatory or neoplastic. There are small bilateral effusions layering dependently with some fluid in the fissures. Basilar markings are more prominent, probably due to a combination of the effusions and volume loss. Basilar pneumonia is not excluded. No acute bone finding. IMPRESSION: Chronic lung disease likely representing emphysema. Probable lower lung bronchiectasis. Abnormal interstitial markings, with multinodular pattern. This could be chronic or could represent acute pneumonia or neoplastic disease. Small bilateral effusions. Electronically Signed   By: Paulina Fusi M.D.   On: 01/15/2019 14:16    Assessment/Plan GI bleed Have made herurgentreferral to GI waiting to hear from them Stopped Eliquis for now Repeat Hgb has come up to 8.6 Continue on iron.  Continue on Protonix If she restart having Black tarry Guaiac stools will send to ED  Patient has bleed reluctant to go before due to Covid virus  Repeat CBC in few days LEEdema On Both Torsemide and Lasix and finally she has lost 10 lbs and her edema  is better Discontinue Lasix Continue torsemide Repeat BMP in a week  COPD On Chronic Oxygen but not on anyinhalers She is coughing today with wheezing Will start her on Prednisone taper 40 mg over next 1 week Also continue Duo Nebs On Spiriva Hyponatremia Was normal today but will follow closely on Toresimide Atrial fibrillation with RVR In sinus rhythm Eliquis was discontinued due to GI bleed On Amiodarone Will need follow up with Cardiology on discharge  S/Pchest tube placementfor Right Parapnuemonic Effusion/Empyemea Finished antibiotics for 2 weeks in hospital Repeat Chest Xray showed mild Effusion  Severe protein-calorie malnutrition Talked to Dietician for Supplements      Family/ staff Communication:   Labs/tests ordered:   Patient will need follow up from her PCP and GI she will need to follow with Cardiology Total time spent in this patient care encounter was 45_ minutes; greater than 50% of the visit spent counseling patient, reviewing records , Labs and coordinating care for problems addressed at this encounter.

## 2019-01-20 ENCOUNTER — Non-Acute Institutional Stay (SKILLED_NURSING_FACILITY): Payer: Medicare HMO | Admitting: Internal Medicine

## 2019-01-20 ENCOUNTER — Encounter: Payer: Self-pay | Admitting: Internal Medicine

## 2019-01-20 DIAGNOSIS — I4891 Unspecified atrial fibrillation: Secondary | ICD-10-CM

## 2019-01-20 DIAGNOSIS — R600 Localized edema: Secondary | ICD-10-CM

## 2019-01-20 DIAGNOSIS — E871 Hypo-osmolality and hyponatremia: Secondary | ICD-10-CM

## 2019-01-20 DIAGNOSIS — J439 Emphysema, unspecified: Secondary | ICD-10-CM

## 2019-01-20 DIAGNOSIS — Z9981 Dependence on supplemental oxygen: Secondary | ICD-10-CM

## 2019-01-20 NOTE — Progress Notes (Signed)
Location:  [facility] Room Number: 102 P Place of Service:  SNF [phone]  Provider: Edmon CrapeArlo Zayde Stroupe, PA  PCP: Brandy Carter, Brandy L, MD Patient Care Team: Brandy Carter, Brandy L, MD as PCP - General (Internal Medicine) Brandy Carter, Brandy Friendsobert M, MD as Consulting Physician (Gastroenterology)  Extended Emergency Contact Information Primary Emergency Contact: Brandy Carter, [facility] Address: [id number]          [place], [place] [id number] [place] Work Phone: [phone] Relation: Other  Code Status: Full Code Goals of care:  Advanced Directive information Advanced Directives 01/20/2019  Does Patient Have a Medical Advance Directive? No  Type of Advance Directive -  Does patient want to make changes to medical advance directive? No - Patient declined  Would patient like information on creating a medical advance directive? No - Patient declined     Allergies  Allergen Reactions  . Vibramycin [Doxycycline Calcium]     Chief Complaint  Patient presents with  . Discharge Note    Discharging to home on 01/21/2019    HPI  85 y.o. female seen today for discharge from facility tomorrow she will be going home with her son.  She has been in skilled nursing for therapy.  She [facility] was [facility] in early March for a right parapneumonic effusion as well as new onset A. fib with rapid ventricular rate and hyponatremia.  She has a history of COPD continues on chronic oxygen as well as hypertension hyponatremia and recent herpes zoster back in January.  Patient was admitted to the hospital from cardiology for severe shortness of breath on exertion.  She was found to have a moderate sized multifocal subpulmonic right pleural effusion.  She required a chest tube her fluid did grow out strep intermedius and she was treated with 2 weeks of Rocephin.  She also had a left-sided pleural effusion but the fluid was transudate  Her  hospitalization was complicated by atrial fibrillation with rapid ventricular rate it was not controlled with diltiazem and she was started on IV amiodarone loading dose.  She was also started on Eliquis.  She also has a history of hyponatremia and it worsened in the hospital eventually she was treated with Lasix.  She also had right-sided pain but Neurontin appeared to help with this.  In the facility she developed severe lower extremity edema and needed aggressive diuresis-she also had melena and was started on Protonix her Eliquis was stopped.  Hemoglobin did drop from 10 down to 8 stabilized at around 8.6 per lab done yesterday.   She had a cough with productive sputum and shortness of breath yesterday and she was started on a prednisone taper.  She appears to be doing better today    Recent  Chest x-ray recently showed clearness with resolution of her effusion.  She will be going home with her son she will need oxygen as well as a walker to assist with ambulation- she does have a GI consult pending on April 13 which has been arranged she also will have follow-up with her primary care provider and home health support         Past Medical History:  Diagnosis Date  . Allergy   . Anxiety   . Asthma   . Atrial fibrillation with RVR (HCC)   . Bronchiectasis (HCC)   . COPD (chronic obstructive pulmonary disease) (HCC)   . Hypertension   . On home oxygen therapy   .  Severe protein-calorie malnutrition (HCC)   . Shingles    Jan 2020; on back   . SVT (supraventricular tachycardia) (HCC)     Past Surgical History:  Procedure Laterality Date  . APPENDECTOMY    . BREAST SURGERY    . CATARACT EXTRACTION    . PILONIDAL CYST / SINUS EXCISION        reports that she has never smoked. She has never used smokeless tobacco. She reports that she does not drink alcohol or use drugs. Social History   Socioeconomic History  . Marital status: Widowed    Spouse name: Not on file   . Number of children: 1  . Years of education: Not on file  . Highest education level: Not on file  Occupational History  . Not on file  Social Needs  . Financial resource strain: Not on file  . Food insecurity:    Worry: Not on file    Inability: Not on file  . Transportation needs:    Medical: Not on file    Non-medical: Not on file  Tobacco Use  . Smoking status: Never Smoker  . Smokeless tobacco: Never Used  Substance and Sexual Activity  . Alcohol use: Never    Frequency: Never  . Drug use: Never  . Sexual activity: Not Currently  Lifestyle  . Physical activity:    Days per week: Not on file    Minutes per session: Not on file  . Stress: Not on file  Relationships  . Social connections:    Talks on phone: Not on file    Gets together: Not on file    Attends religious service: Not on file    Active member of club or organization: Not on file    Attends meetings of clubs or organizations: Not on file    Relationship status: Not on file  . Intimate partner violence:    Fear of current or ex partner: Not on file    Emotionally abused: Not on file    Physically abused: Not on file    Forced sexual activity: Not on file  Other Topics Concern  . Not on file  Social History Narrative  . Not on file   Functional Status Survey:    Allergies  Allergen Reactions  . Vibramycin [Doxycycline Calcium]     Pertinent  Health Maintenance Due  Topic Date Due  . DEXA SCAN  02/05/2019 (Originally 01/27/1998)  . PNA vac Low Risk Adult (2 of 2 - PCV13) 02/05/2019 (Originally 07/15/2010)  . INFLUENZA VACCINE  05/16/2019    Medications: Outpatient Encounter Medications as of 01/20/2019  Medication Sig  . acetaminophen (TYLENOL) 500 MG tablet Take 500 mg by mouth every 6 (six) hours as needed.  Marland Kitchen albuterol (PROVENTIL) (2.5 MG/3ML) 0.083% nebulizer solution Take 2.5 mg by nebulization every 4 (four) hours as needed for wheezing or shortness of breath.  . ALPRAZolam (XANAX) 0.25  MG tablet Take 0.5 tablets (0.125 mg total) by mouth at bedtime as needed for up to 14 days for anxiety.  Marland Kitchen amiodarone (PACERONE) 200 MG tablet Take 200 mg by mouth daily.   . bisacodyl (FLEET) 10 MG/30ML ENEM Place 10 mg rectally daily as needed.  . cholecalciferol (VITAMIN D3) 25 MCG (1000 UT) tablet Take 1,000 Units by mouth daily.  . clotrimazole-betamethasone (LOTRISONE) cream Apply 1 application topically 2 (two) times daily. To buttocks  . Cyanocobalamin (VITAMIN B 12) 100 MCG LOZG Take 1 tablet by mouth daily.  . ferrous sulfate  325 (65 FE) MG tablet Take 325 mg by mouth daily.  Marland Kitchen gabapentin (NEURONTIN) 100 MG capsule Take 100 mg by mouth 2 (two) times daily.  . Melatonin 5 MG TABS Take 1 tablet by mouth at bedtime as needed.  . metoprolol tartrate (LOPRESSOR) 25 MG tablet Take 12.5 mg by mouth 2 (two) times daily.  . NON FORMULARY Diet Type:  Downgraded to grounded meat per therapy until speech therapy does evaluation next week  . Nutritional Supplements (ENSURE ENLIVE PO) Take 1 Bottle by mouth 2 (two) times daily between meals.  Sherren Mocha Supplies (SKIN PREP WIPES) MISC Apply topically to bilateral heels every shift  . OXYGEN Inhale 2 Carter/min into the lungs continuous.  . pantoprazole (PROTONIX) 40 MG tablet Take 40 mg by mouth daily.  . potassium chloride SA (K-DUR,KLOR-CON) 20 MEQ tablet Take 20 mEq by mouth daily.  . predniSONE (DELTASONE) 10 MG tablet 01/20/2019 - 01/21/2019 give 4 tabs = 40 mg daily x 2 days 01/22/2019 - 01/23/2019 give 3 tabs = 30 mg daily x 2 days 01/24/2019 - 01/25/2019 - give 2 tabs = 20 mg x 2 days 01/26/2019 - 01/27/2019 give 1 tab = 10 mg x 2 days  . [START ON 01/28/2019] predniSONE (DELTASONE) 5 MG tablet Take 5 mg by mouth daily with breakfast.  . tiotropium (SPIRIVA) 18 MCG inhalation capsule Place 18 mcg into inhaler and inhale daily. 2 puffs inhalation daily  . torsemide (DEMADEX) 20 MG tablet Take 20 mg by mouth daily.  . [DISCONTINUED] furosemide (LASIX) 20 MG  tablet Take 20 mg by mouth daily.   No facility-administered encounter medications on file as of 01/20/2019.     Review of Systems   In general she is not complaining of fever chills she says she is looking forward to going home.  Skin does not complain of rashes itching or diaphoresis.  Head ears eyes nose mouth and throat does not complain of visual changes or sore throat.  Respiratory is not complaining of shortness of breath today or increased cough.  Cardiac does not complain of chest pain her edema apparently has improved significantly.  GI says she has a good appetite does not complain of nausea vomiting diarrhea or constipation.  Apparently she has had some blood in the stool at times  GU does not complain of dysuria.  Musculoskeletal continues with weakness and general frailty.  Neurologic positive for weakness negative for dizziness or headache.  And psych she appears to be in good spirits apparently previously was somewhat hesitant about going home but appears to be looking forward to it today  Vitals:   01/20/19 1522  BP: (!) 143/69  Pulse: 71  Temp: (!) 97.3 F (36.3 C)  Weight: 95 lb 9.6 oz (43.4 kg)  Height:  (1.676 Carter)   Body mass index is 15.43 kg/Carter. Physical Exam   In general this is a pleasant somewhat frail-appearing elderly female in no distress.  Her skin is warm and dry.  Eyes visual acuity appears to be intact sclera and conjunctive are clear.  Oropharynx is clear mucous membranes moist.  Chest she has shallow air entry but could not really appreciate wheezing No sign of labored breathing.  Heart is regular rate and rhythm without murmur gallop or rub she has moderate lower extremity edema.  Abdomen is soft nontender with active bowel sounds.  Musculoskeletal appropriate does move all extremities x4 with generalized weakness she would benefit from therapy.  Neurologic is grossly intact she  is weak but could not appreciate focal  deficits her speech is clear.  Psych she is alert and oriented very pleasant and   Labs reviewed: Basic Metabolic Panel: Recent Labs    01/06/19 0600 01/12/19 0700 01/15/19 0615 01/19/19 0551  NA 138 137 138 139  K 4.4 4.1 3.8 3.9  CL 92* 93* 89* 90*  CO2 40* 37* 40* 41*  GLUCOSE 82 81 75 84  BUN 28* 27* 29* 31*  CREATININE 0.85 0.87 1.00 1.09*  CALCIUM 8.1* 8.1* 8.5* 8.5*  MG 1.9  --   --   --    Liver Function Tests: Recent Labs    01/06/19 0600 01/19/19 0551  AST 29 26  ALT 18 17  ALKPHOS 83 68  BILITOT 0.4 0.3  PROT 5.4* 6.1*  ALBUMIN 2.3* 2.7*   No results for input(s): LIPASE, AMYLASE in the last 8760 hours. No results for input(s): AMMONIA in the last 8760 hours. CBC: Recent Labs    01/12/19 0700 01/15/19 0615 01/19/19 0551  WBC 8.0 5.7 5.5  NEUTROABS 4.6 3.8 3.4  HGB 8.0* 8.9* 8.6*  HCT 26.4* 27.9* 25.9*  MCV 97.8 99.3 102.0*  PLT 240 300 255   Cardiac Enzymes: No results for input(s): CKTOTAL, CKMB, CKMBINDEX, TROPONINI in the last 8760 hours. BNP: Invalid input(s): POCBNP CBG: No results for input(s): GLUCAP in the last 8760 hours.  Procedures and Imaging Studies During Stay: Dg Chest 2 View  Result Date: 01/15/2019 CLINICAL DATA:  Pleural effusion EXAM: CHEST - 2 VIEW COMPARISON:  None. FINDINGS: Heart size is upper limits of normal. There is aortic atherosclerosis. The lungs are hyperinflated consistent with emphysema. Relative lucency at the apices. Coarsened interstitial markings, at least somewhat likely to be chronic. Probable bronchiectasis at the lung bases. Some nodular component is present. This is nonspecific and could be inflammatory or neoplastic. There are small bilateral effusions layering dependently with some fluid in the fissures. Basilar markings are more prominent, probably due to a combination of the effusions and volume loss. Basilar pneumonia is not excluded. No acute bone finding. IMPRESSION: Chronic lung disease likely  representing emphysema. Probable lower lung bronchiectasis. Abnormal interstitial markings, with multinodular pattern. This could be chronic or could represent acute pneumonia or neoplastic disease. Small bilateral effusions. Electronically Signed   By: Paulina Fusi Carter.D.   On: 01/15/2019 14:16    Assessment/Plan:    #1 history of GI bleed she does have a referral to GI apparently this has been set up for April 13.  She is off Eliquis for the time being hemoglobin is 8.6 she will need a repeat CBC by home health when she goes home.  She continues on Protonix as well as iron.  2.  Lower extremity edema this appears improved per what patient tells me and nursing as well-she has been on diuretics Lasix has been discontinued she does continue on the torsemide s-she will need updated labs when she goes home to ensure stability  3.  History of hyponatremia this has normalized-sodium was 139 on lab done yesterday and again this will need follow-up.  4.  History of fibrillation this appears rate controlled currently.  Her Eliquis has been discontinued because of her GI bleed she is on amiodarone.  She will need follow-up by cardiology once she is discharged.  5 history of COPD continues on chronic oxygen- had had coughing with wheezing but this has improved from what I see and hear  here today- she is on prednisone  as well as duo nebs and Spiriva appears to be improving.  6- strep parapneumonic effusion on the right-with empyema she did have a repeat chest x-ray that showed a mild effusion clinically she appears to be doing well   she will be going home with her son who is quite supportive she will need a walker- GI referral has been ordered- she will be followed by [facility] home [facility]- she will have follow-up by her primary care provider as well.   [id number] note greater than 30-minute spent assessing patient-reviewing her chart and labs-discussing her status with nursing staff and  coordinating a plan of care

## 2019-01-21 ENCOUNTER — Encounter: Payer: Self-pay | Admitting: Internal Medicine

## 2019-01-21 NOTE — Telephone Encounter (Signed)
This encounter was created in error - please disregard.

## 2019-01-26 ENCOUNTER — Telehealth: Payer: Self-pay | Admitting: Internal Medicine

## 2019-01-26 ENCOUNTER — Other Ambulatory Visit: Payer: Self-pay

## 2019-01-26 ENCOUNTER — Ambulatory Visit: Payer: Medicare HMO | Admitting: Gastroenterology

## 2019-01-26 NOTE — Telephone Encounter (Signed)
I would make contact with patient who apparently was D/C'd from nursing home to stay with her son.   I would offer her GI consult here (via virtual) OR they can see someone locally (referral was for heme + stool, dropping Hgb).

## 2019-01-26 NOTE — Telephone Encounter (Signed)
Pt is scheduled Facetime with [facility] tomorrow

## 2019-01-26 NOTE — Telephone Encounter (Signed)
Pt was scheduled a Facetime appt with [facility] for today at 11am. I called the [facility] to get the phone number that the provider would need. Per Juliette at the [facility] patient was discharged last week. Does patient need to reschedule? Her address in epic is [place]. Please advise

## 2019-01-27 ENCOUNTER — Other Ambulatory Visit: Payer: Self-pay

## 2019-01-27 ENCOUNTER — Encounter: Payer: Self-pay | Admitting: Gastroenterology

## 2019-01-27 ENCOUNTER — Ambulatory Visit (INDEPENDENT_AMBULATORY_CARE_PROVIDER_SITE_OTHER): Payer: Medicare HMO | Admitting: Gastroenterology

## 2019-01-27 DIAGNOSIS — D649 Anemia, unspecified: Secondary | ICD-10-CM | POA: Diagnosis not present

## 2019-01-27 DIAGNOSIS — R195 Other fecal abnormalities: Secondary | ICD-10-CM | POA: Insufficient documentation

## 2019-01-27 NOTE — Progress Notes (Signed)
Primary Care Physician: [facility] Primary GI:  Roetta Sessions, MD Referring provider: Dr. Chales Abrahams at [facility] nursing center  Patient Location: Home  Provider Location: [facility] office  Reason for Visit: Hemoccult positive stool, decrease in hemoglobin and hematocrit  Persons present on the virtual encounter, with roles: Patient, patient's son Brandy Carter), myself (provider), Carver Fila, CMA (updated meds and allergies)  Total time (minutes) spent on medical discussion: 32 minutes  Due to COVID-19, visit was conducted using face time method.  Visit was requested by patient.  Virtual Visit via face time  I connected with Eliese Grunert on 01/27/19 at  9:00 AM EDT by face time and verified that I am speaking with the correct person using two identifiers.   I discussed the limitations, risks, security and privacy concerns of performing an evaluation and management service by telephone/video and the availability of in person appointments. I also discussed with the patient that there may be a patient responsible charge related to this service. The patient expressed understanding and agreed to proceed.   HPI:   Brandy Carter is a 85 y.o. female who presents for virtual visit regarding heme positive stool and decline in hemoglobin.  Referral initially came across from [facility] nursing center, Dr. Chales Abrahams.  She was there for rehab but has since been discharged home with her son, Brandy Carter.  Patient was admitted to [facility] nursing center from [facility] after right parapneumonic effusion requiring chest tube placement, fluid grew strep intermedius and she was treated with 2 weeks of ceftriaxone.  She developed new onset A. fib new with RVR.  Placed on Eliquis. She had a prolonged hospitalization from March 4 through March 24 at [facility].  She has a past medical history of COPD on chronic oxygen, shingles in January.  Patient has been having dark stools at least while at the [facility].   She had been on iron.  She was heme positive x3.  She was on Eliquis which had to be stopped.  On March 24 her hemoglobin was 8.8, hematocrit 29.1, MCV 96.4, platelets 268,000, white blood cell count 8000.  Albumin 2.3.  She was heme positive x3 for March 25 of the 29th.  On March 30 her iron was 33, iron saturation is 13%, TIBC 250 all normal.  Hemoglobin down to 8.  On April 2 her hemoglobin was 8.9.  And on April 6 her hemoglobin was 8.6, MCV 102.  BUN 31, creatinine 1.09, albumin 2.7.  Notably back in January of this year her hemoglobin was 12.7.  On March 4 when she was admitted it was 11.1.  On March 20 it was 9.  Chest x-ray April 2 showing emphysema, probable lower lung bronchiectasis.  Abnormal interstitial markings with multinodular pattern.  Could be chronic or could represent acute pneumonia or neoplastic disease.  Small bilateral effusions.  She has not been able to follow-up with her cardiologist, pulmonologist, PCP since her discharge.  Currently using [facility] in [place] because she is staying with her son.  Patient is currently staying with her son, Brandy Carter.  His address is [address], [place], [address].  His phone [phone].  Patient states her stools have been dark for a long time.  They are not black.  Currently off iron for about 4 to 5 days.  Was not provided with prescription when she was discharged from the nursing home and has not purchased over-the-counter yet.  She was having some diarrhea while at the [facility].  This has resolved.  She is having 1 stool per day.  Denies any abdominal pain.  Her appetite is excellent.  She denies any heartburn or dysphagia.  Overall she is feeling much stronger.  She still ambulating with a walker.  Prior to this illness she was driving and living at home alone.  She had a colonoscopy years ago.  She cannot recall the details.  No prior upper endoscopy.  She denies receiving a blood transfusion with her recent illness.     Current Outpatient Medications  Medication Sig Dispense Refill  . acetaminophen (TYLENOL) 500 MG tablet Take 500 mg by mouth every 6 (six) hours as needed.    Marland Kitchen albuterol (PROVENTIL) (2.5 MG/3ML) 0.083% nebulizer solution Take 2.5 mg by nebulization every 4 (four) hours as needed for wheezing or shortness of breath.    . ALPRAZolam (XANAX) 0.25 MG tablet Take 0.5 tablets (0.125 mg total) by mouth at bedtime as needed for up to 14 days for anxiety. 7 tablet 0  . amiodarone (PACERONE) 200 MG tablet Take 200 mg by mouth daily.     . cholecalciferol (VITAMIN D3) 25 MCG (1000 UT) tablet Take 1,000 Units by mouth daily.    . Cyanocobalamin (VITAMIN B 12) 100 MCG LOZG Take 1 tablet by mouth daily.    Marland Kitchen gabapentin (NEURONTIN) 100 MG capsule Take 100 mg by mouth 2 (two) times daily.    . metoprolol tartrate (LOPRESSOR) 25 MG tablet Take 12.5 mg by mouth 2 (two) times daily.    . Nutritional Supplements (ENSURE ENLIVE PO) Take 1 Bottle by mouth daily.     . OXYGEN Inhale 2 L/min into the lungs continuous.    . pantoprazole (PROTONIX) 40 MG tablet Take 40 mg by mouth daily.    . potassium chloride SA (K-DUR,KLOR-CON) 20 MEQ tablet Take 20 mEq by mouth daily.    . predniSONE (DELTASONE) 10 MG tablet 01/20/2019 - 01/21/2019 give 4 tabs = 40 mg daily x 2 days 01/22/2019 - 01/23/2019 give 3 tabs = 30 mg daily x 2 days 01/24/2019 - 01/25/2019 - give 2 tabs = 20 mg x 2 days 01/26/2019 - 01/27/2019 give 1 tab = 10 mg x 2 days    . tiotropium (SPIRIVA) 18 MCG inhalation capsule Place 18 mcg into inhaler and inhale daily. 2 puffs inhalation daily    . torsemide (DEMADEX) 20 MG tablet Take 20 mg by mouth daily.     No current facility-administered medications for this visit.     Past Medical History:  Diagnosis Date  . Allergy   . Anxiety   . Asthma   . Atrial fibrillation with RVR (HCC)   . Bronchiectasis (HCC)   . COPD (chronic obstructive pulmonary disease) (HCC)   . Hypertension   . On home oxygen therapy   .  Parapneumonic effusion    s/p chest tube 12/2018  . Severe protein-calorie malnutrition (HCC)   . Shingles    Jan 2020; on back   . SVT (supraventricular tachycardia) (HCC)     Past Surgical History:  Procedure Laterality Date  . APPENDECTOMY    . BREAST SURGERY    . CATARACT EXTRACTION    . PILONIDAL CYST / SINUS EXCISION      Family History  Problem Relation Age of Onset  . Hypertension Sister   . Heart disease Brother     Social History   Socioeconomic History  . Marital status: Widowed    Spouse name: Not on file  .  Number of children: 1  . Years of education: Not on file  . Highest education level: Not on file  Occupational History  . Not on file  Social Needs  . Financial resource strain: Not on file  . Food insecurity:    Worry: Not on file    Inability: Not on file  . Transportation needs:    Medical: Not on file    Non-medical: Not on file  Tobacco Use  . Smoking status: Never Smoker  . Smokeless tobacco: Never Used  Substance and Sexual Activity  . Alcohol use: Never    Frequency: Never  . Drug use: Never  . Sexual activity: Not Currently  Lifestyle  . Physical activity:    Days per week: Not on file    Minutes per session: Not on file  . Stress: Not on file  Relationships  . Social connections:    Talks on phone: Not on file    Gets together: Not on file    Attends religious service: Not on file    Active member of club or organization: Not on file    Attends meetings of clubs or organizations: Not on file    Relationship status: Not on file  . Intimate partner violence:    Fear of current or ex partner: Not on file    Emotionally abused: Not on file    Physically abused: Not on file    Forced sexual activity: Not on file  Other Topics Concern  . Not on file  Social History Narrative  . Not on file      ROS:  General: Negative for anorexia, weight loss, fever, chills, positive fatigue, positive weakness. Eyes: Negative for vision  changes.  ENT: Negative for hoarseness, difficulty swallowing , nasal congestion. CV: Negative for chest pain, angina, palpitations, dyspnea on exertion, peripheral edema.  Respiratory: Negative for dyspnea at rest, dyspnea on exertion, cough, sputum, wheezing.  GI: See history of present illness. GU:  Negative for dysuria, hematuria, urinary incontinence, urinary frequency, nocturnal urination.  MS: Negative for joint pain, low back pain.  Derm: Negative for rash or itching.  Neuro: Negative for weakness, abnormal sensation, seizure, frequent headaches, memory loss, confusion.  Psych: Negative for anxiety, depression, suicidal ideation, hallucinations.  Endo: Negative for unusual weight change.  Heme: Negative for bruising or bleeding. Allergy: Negative for rash or hives.   Observations/Objective: Pleasant elderly Caucasian female in no acute distress.  Son is present.  Patient is able to provide 95% of the history.  She is alert and oriented.  Oxygen in place via nasal cannula.  She weighed 103 pounds on March 25 at the nursing home.  Lab Results  Component Value Date   CREATININE 1.09 (H) 01/19/2019   BUN 31 (H) 01/19/2019   NA 139 01/19/2019   K 3.9 01/19/2019   CL 90 (L) 01/19/2019   CO2 41 (H) 01/19/2019   Lab Results  Component Value Date   ALT 17 01/19/2019   AST 26 01/19/2019   ALKPHOS 68 01/19/2019   BILITOT 0.3 01/19/2019   Lab Results  Component Value Date   IRON 33 01/12/2019   TIBC 250 01/12/2019   No results found for: VITAMINB12 No results found for: FOLATE Lab Results  Component Value Date   WBC 5.5 01/19/2019   HGB 8.6 (L) 01/19/2019   HCT 25.9 (L) 01/19/2019   MCV 102.0 (H) 01/19/2019   PLT 255 01/19/2019     Assessment and Plan: Very pleasant 85 year old  female presenting via virtual visit for further evaluation of heme positive stools and anemia.  Recent significant hospitalization as outlined above.  [facility] at [facility]  [facility] noted to have dark stools, heme positive.  She was on iron.  Slight decline in H&H but really overall stable compared to when she was [facility] at the end of March.  However it is notable that her hemoglobin was normal in January.  She was on Eliquis for several weeks for history of A. fib but this was discontinued about 2 weeks ago due to concern for GI bleeding.  Overall patient reports feeling stronger each day.  Her son feels the same way.  There is been no overt black stools or red blood per rectum.  I encouraged her to hold off on iron for about a week to see if her stools lighten up.  If they do then we would assume that the dark stools are related to iron.  Regardless she was heme positive.  We discussed typical work-up including at least an upper endoscopy and serial H&H.  Patient is very reluctant to leaving her home due to COVID-19 risk in her recent significant hospitalizations.  It is not clear that she would be a good endoscopic candidate at this point given her comorbidities specifically her pulmonary status.  Will discuss his case with Dr. [name] we will come up with a game plan with regards to timing of work-up.  For now patient will continue PPI therapy.  Resume iron next week.  If at any time she develops worsening shortness of breath, weakness, lightheadedness or sees black or bloody stools she will go to the emergency department.  Follow Up Instructions:    I discussed the assessment and treatment plan with the patient. The patient was provided an opportunity to ask questions and all were answered. The patient agreed with the plan and demonstrated an understanding of the instructions. AVS mailed to patient's home address.   The patient was advised to call back or seek an in-person evaluation if the symptoms worsen or if the condition fails to improve as anticipated.  I provided 32 minutes of virtual face-to-face time during this encounter.    Tana CoastLeslie Madora Barletta, PA-C

## 2019-01-27 NOTE — Patient Instructions (Addendum)
1. Hold the iron for the next 5 to 7 days.  I would like to see if your stools lighten up in color to determine if the dark stool color has been related to iron use.  After that you can resume over-the-counter iron such as ferrous sulfate 325 mg twice daily at least for the next 6 to 8 weeks. 2. Continue pantoprazole once daily before breakfast to provide stomach protection.  This will also heal any possible gastritis or ulcer that may be contributing to drop in your hemoglobin recently. 3. Dr. Jena Gauss recommends going to the lab in approximately 2 weeks to have your blood work done to follow-up on hemoglobin.  As long as your hemoglobin is stable we will hold off on any urgent endoscopy right now.  Please go to the [facility] at [address], [address], [place].  4. Please follow-up with your cardiologist and pulmonologist as soon as you are able to the setting of this COVID-19 crisis. They may do virtual visits like we did or may ask you come to see them after the crisis has improved.

## 2019-01-28 NOTE — Progress Notes (Signed)
CC'ED TO REFERRING AND PCP 

## 2019-01-28 NOTE — Progress Notes (Signed)
Discussed case with Dr. Jena Gauss. Patient is pretty frail right now. O2 dependent COPD. Probably not a candidate for any sort of endoscopic evaluation right now. It is not clear that she is having true melena in the setting of dark stools/iron that she is at least having occult GI bleeding of the obscure etiology.    Dr. Jena Gauss recommends recheck hemoglobin and hematocrit in a couple of weeks.  He recommends her seeing her pulmonologist once this COVID-19 crisis blows by. Unless drop in H&H is documented, will hold off on any urgent GI evaluation. Agree with PPI.  PLEASE INFORM PATIENT OR SON OF ABOVE RMR RECOMMENDATIONS.  PLEASE PLAN FOR CBC IN TWO WEEKS. I HAVE PLACED ORDERS.

## 2019-02-12 NOTE — Progress Notes (Signed)
I spoke with the patient's son and made him aware of Leslie's recommendations.  He also stated the patient doesn't have an appt with her pulmonologist, however he will make an appt at a later date with them.  He went on to say the patient had lab work done at her pcp's office and everything was stable.  I called the pcp's office and requested labs and placed them on Leslie's desk.

## 2019-02-18 NOTE — Progress Notes (Signed)
Reviewed labs we received from PCP office.  There were no recent labs, last ones were done April 6 while still at the nursing home.  Darl Pikes, Can we request last labs from [facility], any done after 01/19/19.

## 2019-02-19 NOTE — Progress Notes (Signed)
Requested labs done after 01/19/2019 from [facility]

## 2019-02-25 ENCOUNTER — Telehealth: Payer: Self-pay | Admitting: Gastroenterology

## 2019-02-25 NOTE — Telephone Encounter (Signed)
Received labs from PCP dated 01/29/2019: Glucose 67, BUN 29, creatinine 0.94, albumin 1.1, total bilirubin 0.5, alkaline phosphatase 99, AST 33, ALT 31, white blood cell count 10,700, hemoglobin 9.9, hematocrit 27.3, MCV 98, platelets 266,000.  Please let patient son know that we did obtain copy of her latest labs from PCP, hemoglobin was slightly improved.  Protein level is significantly lower likely due to chronic illness.  Encouraged her to increase dietary protein, consider adding Ensure or equivalent if she is not already consuming daily.  Would offer an office visit with Dr. Jena Gauss, face-to-face, July 2020

## 2019-02-26 NOTE — Telephone Encounter (Signed)
Spoke to son and he wanted [place] to call his mother as well. His mother is aware. Please schedule face to face apt July with RMR

## 2019-03-02 NOTE — Telephone Encounter (Signed)
RMR IS NOT IN THE OFFICE IN July, I ADDED TO THE AUGUST RECALL

## 2019-03-02 NOTE — Telephone Encounter (Signed)
Noted  

## 2019-03-06 ENCOUNTER — Other Ambulatory Visit: Payer: Self-pay | Admitting: Internal Medicine

## 2019-03-07 ENCOUNTER — Other Ambulatory Visit: Payer: Self-pay | Admitting: Internal Medicine

## 2019-03-10 ENCOUNTER — Other Ambulatory Visit: Payer: Self-pay | Admitting: Internal Medicine

## 2019-03-11 ENCOUNTER — Other Ambulatory Visit: Payer: Self-pay | Admitting: Internal Medicine

## 2019-05-03 ENCOUNTER — Emergency Department
Admission: EM | Admit: 2019-05-03 | Discharge: 2019-05-03 | Disposition: A | Discharge status: Home / Self Care | Payer: Medicare HMO | Source: Home / Self Care

## 2019-05-03 ENCOUNTER — Other Ambulatory Visit: Payer: Self-pay

## 2019-05-20 ENCOUNTER — Encounter: Payer: Self-pay | Admitting: Internal Medicine

## 2019-10-28 ENCOUNTER — Observation Stay (HOSPITAL_COMMUNITY): Payer: Medicare HMO

## 2019-10-28 ENCOUNTER — Other Ambulatory Visit: Payer: Self-pay

## 2019-10-28 ENCOUNTER — Encounter (HOSPITAL_COMMUNITY): Payer: Self-pay | Admitting: Emergency Medicine

## 2019-10-28 ENCOUNTER — Emergency Department (HOSPITAL_COMMUNITY): Payer: Medicare HMO

## 2019-10-28 ENCOUNTER — Inpatient Hospital Stay (HOSPITAL_COMMUNITY)
Admission: EM | Admit: 2019-10-28 | Discharge: 2019-10-30 | DRG: 193 | Disposition: A | Discharge status: Home Health | Payer: Medicare HMO | Attending: Internal Medicine | Admitting: Internal Medicine

## 2019-10-28 DIAGNOSIS — Z681 Body mass index (BMI) 19 or less, adult: Secondary | ICD-10-CM | POA: Diagnosis not present

## 2019-10-28 DIAGNOSIS — J129 Viral pneumonia, unspecified: Principal | ICD-10-CM | POA: Diagnosis present

## 2019-10-28 DIAGNOSIS — N183 Chronic kidney disease, stage 3 unspecified: Secondary | ICD-10-CM | POA: Diagnosis present

## 2019-10-28 DIAGNOSIS — E43 Unspecified severe protein-calorie malnutrition: Secondary | ICD-10-CM | POA: Diagnosis present

## 2019-10-28 DIAGNOSIS — R0602 Shortness of breath: Secondary | ICD-10-CM | POA: Diagnosis present

## 2019-10-28 DIAGNOSIS — J441 Chronic obstructive pulmonary disease with (acute) exacerbation: Secondary | ICD-10-CM | POA: Diagnosis present

## 2019-10-28 DIAGNOSIS — F419 Anxiety disorder, unspecified: Secondary | ICD-10-CM | POA: Diagnosis present

## 2019-10-28 DIAGNOSIS — Z8249 Family history of ischemic heart disease and other diseases of the circulatory system: Secondary | ICD-10-CM | POA: Diagnosis not present

## 2019-10-28 DIAGNOSIS — I4891 Unspecified atrial fibrillation: Secondary | ICD-10-CM | POA: Diagnosis present

## 2019-10-28 DIAGNOSIS — Z881 Allergy status to other antibiotic agents status: Secondary | ICD-10-CM | POA: Diagnosis not present

## 2019-10-28 DIAGNOSIS — I129 Hypertensive chronic kidney disease with stage 1 through stage 4 chronic kidney disease, or unspecified chronic kidney disease: Secondary | ICD-10-CM | POA: Diagnosis present

## 2019-10-28 DIAGNOSIS — I10 Essential (primary) hypertension: Secondary | ICD-10-CM | POA: Diagnosis present

## 2019-10-28 DIAGNOSIS — J9621 Acute and chronic respiratory failure with hypoxia: Secondary | ICD-10-CM | POA: Diagnosis present

## 2019-10-28 DIAGNOSIS — N179 Acute kidney failure, unspecified: Secondary | ICD-10-CM | POA: Diagnosis present

## 2019-10-28 DIAGNOSIS — Z20822 Contact with and (suspected) exposure to covid-19: Secondary | ICD-10-CM | POA: Diagnosis present

## 2019-10-28 DIAGNOSIS — J440 Chronic obstructive pulmonary disease with acute lower respiratory infection: Secondary | ICD-10-CM | POA: Diagnosis present

## 2019-10-28 DIAGNOSIS — Z9981 Dependence on supplemental oxygen: Secondary | ICD-10-CM

## 2019-10-28 DIAGNOSIS — J9611 Chronic respiratory failure with hypoxia: Secondary | ICD-10-CM | POA: Diagnosis not present

## 2019-10-28 DIAGNOSIS — J9601 Acute respiratory failure with hypoxia: Secondary | ICD-10-CM

## 2019-10-28 DIAGNOSIS — J189 Pneumonia, unspecified organism: Secondary | ICD-10-CM

## 2019-10-28 LAB — LACTATE DEHYDROGENASE: LDH: 141 U/L (ref 98–192)

## 2019-10-28 LAB — COMPREHENSIVE METABOLIC PANEL
ALT: 28 U/L (ref 0–44)
AST: 35 U/L (ref 15–41)
Albumin: 3.3 g/dL — ABNORMAL LOW (ref 3.5–5.0)
Alkaline Phosphatase: 85 U/L (ref 38–126)
Anion gap: 11 (ref 5–15)
BUN: 43 mg/dL — ABNORMAL HIGH (ref 8–23)
CO2: 36 mmol/L — ABNORMAL HIGH (ref 22–32)
Calcium: 8.9 mg/dL (ref 8.9–10.3)
Chloride: 91 mmol/L — ABNORMAL LOW (ref 98–111)
Creatinine, Ser: 1.35 mg/dL — ABNORMAL HIGH (ref 0.44–1.00)
GFR calc Af Amer: 41 mL/min — ABNORMAL LOW (ref >60)
GFR calc non Af Amer: 35 mL/min — ABNORMAL LOW (ref >60)
Glucose, Bld: 94 mg/dL (ref 70–99)
Potassium: 4.5 mmol/L (ref 3.5–5.1)
Sodium: 138 mmol/L (ref 135–145)
Total Bilirubin: 0.5 mg/dL (ref 0.3–1.2)
Total Protein: 7.5 g/dL (ref 6.5–8.1)

## 2019-10-28 LAB — CBC WITH DIFFERENTIAL/PLATELET
Abs Immature Granulocytes: 0.08 10*3/uL — ABNORMAL HIGH (ref 0.00–0.07)
Basophils Absolute: 0.1 10*3/uL (ref 0.0–0.1)
Basophils Relative: 1 %
Eosinophils Absolute: 0.5 10*3/uL (ref 0.0–0.5)
Eosinophils Relative: 4 %
HCT: 33.1 % — ABNORMAL LOW (ref 36.0–46.0)
Hemoglobin: 10.3 g/dL — ABNORMAL LOW (ref 12.0–15.0)
Immature Granulocytes: 1 %
Lymphocytes Relative: 14 %
Lymphs Abs: 1.6 10*3/uL (ref 0.7–4.0)
MCH: 30.3 pg (ref 26.0–34.0)
MCHC: 31.1 g/dL (ref 30.0–36.0)
MCV: 97.4 fL (ref 80.0–100.0)
Monocytes Absolute: 1.1 10*3/uL — ABNORMAL HIGH (ref 0.1–1.0)
Monocytes Relative: 10 %
Neutro Abs: 8.2 10*3/uL — ABNORMAL HIGH (ref 1.7–7.7)
Neutrophils Relative %: 70 %
Platelets: 350 10*3/uL (ref 150–400)
RBC: 3.4 MIL/uL — ABNORMAL LOW (ref 3.87–5.11)
RDW: 13.3 % (ref 11.5–15.5)
WBC: 11.4 10*3/uL — ABNORMAL HIGH (ref 4.0–10.5)
nRBC: 0 % (ref 0.0–0.2)

## 2019-10-28 LAB — TRIGLYCERIDES: Triglycerides: 57 mg/dL (ref <150)

## 2019-10-28 LAB — TSH: TSH: 23.318 u[IU]/mL — ABNORMAL HIGH (ref 0.350–4.500)

## 2019-10-28 LAB — FERRITIN: Ferritin: 83 ng/mL (ref 11–307)

## 2019-10-28 LAB — BRAIN NATRIURETIC PEPTIDE: B Natriuretic Peptide: 134 pg/mL — ABNORMAL HIGH (ref 0.0–100.0)

## 2019-10-28 LAB — TROPONIN I (HIGH SENSITIVITY): Troponin I (High Sensitivity): 8 ng/L (ref <18)

## 2019-10-28 LAB — LACTIC ACID, PLASMA: Lactic Acid, Venous: 0.9 mmol/L (ref 0.5–1.9)

## 2019-10-28 LAB — RESPIRATORY PANEL BY RT PCR (FLU A&B, COVID)
Influenza A by PCR: NEGATIVE
Influenza B by PCR: NEGATIVE
SARS Coronavirus 2 by RT PCR: NEGATIVE

## 2019-10-28 LAB — D-DIMER, QUANTITATIVE: D-Dimer, Quant: 1.85 ug/mL-FEU — ABNORMAL HIGH (ref 0.00–0.50)

## 2019-10-28 LAB — FIBRINOGEN: Fibrinogen: 639 mg/dL — ABNORMAL HIGH (ref 210–475)

## 2019-10-28 LAB — C-REACTIVE PROTEIN: CRP: 8.2 mg/dL — ABNORMAL HIGH (ref <1.0)

## 2019-10-28 LAB — PROCALCITONIN: Procalcitonin: <0.1 ng/mL

## 2019-10-28 MED ORDER — METOPROLOL TARTRATE 25 MG PO TABS
12.5000 mg | ORAL_TABLET | Freq: Two times a day (BID) | ORAL | Status: DC
  Administered 2019-10-28 – 2019-10-30 (×4): 12.5 mg via ORAL
  Filled 2019-10-28 (×4): qty 1

## 2019-10-28 MED ORDER — ALBUTEROL SULFATE HFA 108 (90 BASE) MCG/ACT IN AERS
2.0000 | INHALATION_SPRAY | Freq: Once | RESPIRATORY_TRACT | Status: AC
  Administered 2019-10-28: 2 via RESPIRATORY_TRACT
  Filled 2019-10-28: qty 6.7

## 2019-10-28 MED ORDER — IPRATROPIUM-ALBUTEROL 0.5-2.5 (3) MG/3ML IN SOLN: 3.0000 mL | RESPIRATORY_TRACT | Status: DC | PRN

## 2019-10-28 MED ORDER — ACETAMINOPHEN 325 MG PO TABS
650.0000 mg | ORAL_TABLET | Freq: Four times a day (QID) | ORAL | Status: DC | PRN
  Administered 2019-10-28 – 2019-10-30 (×2): 650 mg via ORAL
  Filled 2019-10-28 (×2): qty 2

## 2019-10-28 MED ORDER — ALBUTEROL SULFATE (2.5 MG/3ML) 0.083% IN NEBU: 5.0000 mg | INHALATION_SOLUTION | Freq: Once | RESPIRATORY_TRACT | Status: DC

## 2019-10-28 MED ORDER — IPRATROPIUM-ALBUTEROL 0.5-2.5 (3) MG/3ML IN SOLN
3.0000 mL | Freq: Four times a day (QID) | RESPIRATORY_TRACT | Status: AC
  Administered 2019-10-28 – 2019-10-29 (×2): 3 mL via RESPIRATORY_TRACT
  Filled 2019-10-28 (×2): qty 3

## 2019-10-28 MED ORDER — METHYLPREDNISOLONE SODIUM SUCC 40 MG IJ SOLR
40.0000 mg | Freq: Two times a day (BID) | INTRAMUSCULAR | Status: DC
  Administered 2019-10-28 – 2019-10-30 (×4): 40 mg via INTRAVENOUS
  Filled 2019-10-28 (×4): qty 1

## 2019-10-28 MED ORDER — ONDANSETRON HCL 4 MG/2ML IJ SOLN: 4.0000 mg | Freq: Four times a day (QID) | INTRAMUSCULAR | Status: DC | PRN

## 2019-10-28 MED ORDER — ONDANSETRON HCL 4 MG PO TABS: 4.0000 mg | ORAL_TABLET | Freq: Four times a day (QID) | ORAL | Status: DC | PRN

## 2019-10-28 MED ORDER — ENOXAPARIN SODIUM 30 MG/0.3ML ~~LOC~~ SOLN
30.0000 mg | SUBCUTANEOUS | Status: DC
  Administered 2019-10-28 – 2019-10-29 (×2): 30 mg via SUBCUTANEOUS
  Filled 2019-10-28 (×2): qty 0.3

## 2019-10-28 MED ORDER — SODIUM CHLORIDE 0.9 % IV SOLN
1.0000 g | INTRAVENOUS | Status: DC
  Administered 2019-10-29: 1 g via INTRAVENOUS
  Filled 2019-10-28: qty 10

## 2019-10-28 MED ORDER — AZITHROMYCIN 250 MG PO TABS
500.0000 mg | ORAL_TABLET | Freq: Once | ORAL | Status: AC
  Administered 2019-10-28: 500 mg via ORAL
  Filled 2019-10-28: qty 2

## 2019-10-28 MED ORDER — SODIUM CHLORIDE 0.9 % IV SOLN
500.0000 mg | INTRAVENOUS | Status: DC
  Administered 2019-10-29: 500 mg via INTRAVENOUS
  Filled 2019-10-28: qty 500

## 2019-10-28 MED ORDER — AEROCHAMBER PLUS FLO-VU MEDIUM MISC
1.0000 | Freq: Once | Status: AC
  Administered 2019-10-28: 1
  Filled 2019-10-28: qty 1

## 2019-10-28 MED ORDER — ACETAMINOPHEN 650 MG RE SUPP: 650.0000 mg | Freq: Four times a day (QID) | RECTAL | Status: DC | PRN

## 2019-10-28 MED ORDER — AMIODARONE HCL 200 MG PO TABS
200.0000 mg | ORAL_TABLET | Freq: Every day | ORAL | Status: DC
  Administered 2019-10-29 – 2019-10-30 (×2): 200 mg via ORAL
  Filled 2019-10-28 (×2): qty 1

## 2019-10-28 MED ORDER — POLYETHYLENE GLYCOL 3350 17 G PO PACK: 17.0000 g | PACK | Freq: Every day | ORAL | Status: DC | PRN

## 2019-10-28 MED ORDER — SODIUM CHLORIDE 0.9 % IV SOLN
1.0000 g | Freq: Once | INTRAVENOUS | Status: AC
  Administered 2019-10-28: 1 g via INTRAVENOUS
  Filled 2019-10-28: qty 10

## 2019-10-28 NOTE — ED Notes (Signed)
Pt given meal tray and assisted as needed.

## 2019-10-28 NOTE — ED Notes (Addendum)
Pt's O2 does drop at home to the 70's for the last few weeks, per son.

## 2019-10-28 NOTE — ED Provider Notes (Signed)
[facility] EMERGENCY DEPARTMENT Provider Note   CSN: [id number] Arrival date & time: 10/28/19  1426     History Chief Complaint  Patient presents with  . Shortness of Breath    Brandy Carter is a 86 y.o. female.  HPI   This patient is a pleasant 86 year old female who has a known history of COPD, history of a parapneumonic effusion in March after having a chest tube placed.  History of shingles just prior to that and is chronically treated for atrial fibrillation, bronchiectasis, hypertension and is on home oxygen.  According to the patient she has no other history of obstructive coronary disease.  The patient reports that she is currently living with her son here in [place].  She was admitted to the hospital approximately 10 months ago with what she reports to be in pneumonia but since that time has not had to be readmitted.  She has been very weak over the last 6 weeks which has been progressive and on the way to the doctor's office for a follow-up visit today she was noted to be very short of breath by her son who refused to take her to the doctor but instead bring her to the hospital because of how short of breath she was.  She does endorse being on home oxygen at 2 L which she has been on for several years.  She reports that she takes albuterol treatments throughout the day a couple of times a day with her albuterol metered-dose inhaler and several times a week on a nebulizer machine.  She has noticed some increasing chest congestion but denies any fevers or chills or swelling of the legs.  She is currently constipated, has no dysuria, denies sore throat or nasal congestion.  She does endorse increasing shortness of breath over the last couple of days which has become more severe today.  She also notes that when she ambulates at home her oxygen will drop into the 70s.  Past Medical History:  Diagnosis Date  . Allergy   . Anxiety   . Asthma   . Atrial fibrillation with RVR (HCC)     . Bronchiectasis (HCC)   . COPD (chronic obstructive pulmonary disease) (HCC)   . Hypertension   . On home oxygen therapy   . Parapneumonic effusion    s/p chest tube 12/2018  . Severe protein-calorie malnutrition (HCC)   . Shingles    Jan 2020; on back   . SVT (supraventricular tachycardia) [facility])     Patient Active Problem List   Diagnosis Date Noted  . Heme positive stool 01/27/2019  . Normocytic anemia 01/27/2019  . GI bleed 01/12/2019  . Bilateral leg edema 01/08/2019  . Hyponatremia 01/06/2019  . Atrial fibrillation with RVR (HCC) 01/02/2019  . Post herpetic neuralgia 01/02/2019  . Bronchiectasis without complication (HCC) 01/02/2019  . COPD (chronic obstructive pulmonary disease) with emphysema (HCC) 01/02/2019  . Oxygen dependent 01/02/2019  . Essential hypertension 01/02/2019  . Chronic anxiety 01/02/2019  . Severe protein-calorie malnutrition (HCC) 01/02/2019  . Failure to thrive in adult 01/02/2019  . Anemia of chronic disease 01/02/2019  . Bilateral pleural effusion 01/02/2019  . Community acquired pneumonia of right lung 01/02/2019  . Status post chest tube placement [facility]) 01/02/2019    Past Surgical History:  Procedure Laterality Date  . APPENDECTOMY    . BREAST SURGERY    . CATARACT EXTRACTION    . PILONIDAL CYST / SINUS EXCISION       OB History  Gravida      Para      Term      Preterm      AB      Living  1     SAB      TAB      Ectopic      Multiple      Live Births              Family History  Problem Relation Age of Onset  . Hypertension Sister   . Heart disease Brother     Social History   Tobacco Use  . Smoking status: Never Smoker  . Smokeless tobacco: Never Used  Substance Use Topics  . Alcohol use: Never  . Drug use: Never    Home Medications Prior to Admission medications   Medication Sig Start Date End Date Taking? Authorizing Provider  acetaminophen (TYLENOL) 500 MG tablet Take 500 mg by mouth  every 6 (six) hours as needed.    [provider]  albuterol (PROVENTIL) (2.5 MG/3ML) 0.083% nebulizer solution Take 2.5 mg by nebulization every 4 (four) hours as needed for wheezing or shortness of breath.    [provider]  amiodarone (PACERONE) 200 MG tablet Take 200 mg by mouth daily.  01/07/19   [provider]  cholecalciferol (VITAMIN D3) 25 MCG (1000 UT) tablet Take 1,000 Units by mouth daily.    [provider]  Cyanocobalamin (VITAMIN B 12) 100 MCG LOZG Take 1 tablet by mouth daily.    [provider]  gabapentin (NEURONTIN) 100 MG capsule Take 100 mg by mouth 2 (two) times daily.    [provider]  metoprolol tartrate (LOPRESSOR) 25 MG tablet Take 12.5 mg by mouth 2 (two) times daily.    [provider]  Nutritional Supplements (ENSURE ENLIVE PO) Take 1 Bottle by mouth daily.  01/02/19   [provider]  OXYGEN Inhale 2 L/min into the lungs continuous. 01/02/19   [provider]  pantoprazole (PROTONIX) 40 MG tablet Take 40 mg by mouth daily. 01/07/19   [provider]  potassium chloride SA (K-DUR,KLOR-CON) 20 MEQ tablet Take 20 mEq by mouth daily. 01/07/19   [provider]  predniSONE (DELTASONE) 10 MG tablet 01/20/2019 - 01/21/2019 give 4 tabs = 40 mg daily x 2 days 01/22/2019 - 01/23/2019 give 3 tabs = 30 mg daily x 2 days 01/24/2019 - 01/25/2019 - give 2 tabs = 20 mg x 2 days 01/26/2019 - 01/27/2019 give 1 tab = 10 mg x 2 days    [provider]  tiotropium (SPIRIVA) 18 MCG inhalation capsule Place 18 mcg into inhaler and inhale daily. 2 puffs inhalation daily 01/15/19   [provider]  torsemide (DEMADEX) 20 MG tablet Take 20 mg by mouth daily. 01/13/19   [provider]    Allergies    Vibramycin [doxycycline calcium]  Review of Systems   Review of Systems  All other systems reviewed and are negative.   Physical Exam Updated Vital Signs BP (!) 144/96   Pulse  81   Temp (!) 97.5 F (36.4 C) (Oral)   Resp (!) 25   Ht 1.676 m (5\' 6" )   Wt 38.6 kg   SpO2 94%   BMI 13.72 kg/m   Physical Exam Vitals and nursing note reviewed.  Constitutional:      General: She is in acute distress.     Appearance: She is well-developed.  HENT:     Head:  Normocephalic and atraumatic.     Mouth/Throat:     Pharynx: No oropharyngeal exudate.  Eyes:     General: No scleral icterus.       Right eye: No discharge.        Left eye: No discharge.     Conjunctiva/sclera: Conjunctivae normal.     Pupils: Pupils are equal, round, and reactive to light.  Neck:     Thyroid: No thyromegaly.     Vascular: No JVD.  Cardiovascular:     Rate and Rhythm: Normal rate and regular rhythm.     Heart sounds: Normal heart sounds. No murmur. No friction rub. No gallop.   Pulmonary:     Effort: Respiratory distress present.     Breath sounds: Rhonchi and rales present. No wheezing.     Comments: There is increased work of breathing with some accessory muscle use, tachypnea and a prolonged expiratory phase.  She does speak in shortened sentences Abdominal:     General: Bowel sounds are normal. There is no distension.     Palpations: Abdomen is soft. There is no mass.     Tenderness: There is no abdominal tenderness.  Musculoskeletal:        General: No tenderness. Normal range of motion.     Cervical back: Normal range of motion and neck supple.  Lymphadenopathy:     Cervical: No cervical adenopathy.  Skin:    General: Skin is warm and dry.     Findings: No erythema or rash.  Neurological:     Mental Status: She is alert.     Coordination: Coordination normal.  Psychiatric:        Behavior: Behavior normal.     ED Results / Procedures / Treatments   Labs (all labs ordered are listed, but only abnormal results are displayed) Labs Reviewed  CULTURE, BLOOD (ROUTINE X 2)  CULTURE, BLOOD (ROUTINE X 2)  LACTIC ACID, PLASMA  LACTIC ACID, PLASMA  CBC WITH  DIFFERENTIAL/PLATELET  COMPREHENSIVE METABOLIC PANEL  D-DIMER, QUANTITATIVE (NOT AT [facility])  PROCALCITONIN  LACTATE DEHYDROGENASE  FERRITIN  TRIGLYCERIDES  FIBRINOGEN  C-REACTIVE PROTEIN  BRAIN NATRIURETIC PEPTIDE  TROPONIN I (HIGH SENSITIVITY)    EKG None  Radiology No results found.  Procedures .Critical Care Performed by: Noemi Chapel, MD Authorized by: Noemi Chapel, MD   Critical care provider statement:    Critical care time (minutes):  35   Critical care time was exclusive of:  Separately billable procedures and treating other patients and teaching time   Critical care was necessary to treat or prevent imminent or life-threatening deterioration of the following conditions:  Respiratory failure   Critical care was time spent personally by me on the following activities:  Blood draw for specimens, development of treatment plan with patient or surrogate, discussions with consultants, evaluation of patient's response to treatment, examination of patient, obtaining history from patient or surrogate, ordering and performing treatments and interventions, ordering and review of laboratory studies, ordering and review of radiographic studies, pulse oximetry, re-evaluation of patient's condition and review of old charts   (including critical care time)  Medications Ordered in ED Medications - No data to display  ED Course  I have reviewed the triage vital signs and the nursing notes.  Pertinent labs & imaging results that were available during my care of the patient were reviewed by me and considered in my medical decision making (see chart for details).    MDM Rules/Calculators/A&P  The patient's exam is consistent with increased work of breathing, she has an oxygen of 93 to 94% on her baseline 2 L but has an increased work of breathing and shortness of breath.  This is concerning for multiple different etiologies.  She is not tachycardic, she is not  febrile, she has not been around anybody who has been sick and her son has not been ill at all.  She will need to be checked for Covid, check for pneumonia and need to be given breathing treatments for COPD.  We will also rule out cardiac causes with a troponin and a BNP as well as an EKG.  The patient is agreeable to the plan  The x-ray is consistent with possible infiltrates, she does have very fragile oxygenation and lungs and desaturates when she walks.  BNP is 134, leukocytosis is present at 11,400 and metabolic panel shows a progressive mild uremia and renal dysfunction.  Discussed with the hospitalist Dr. Mariea Clonts who will admit  Brandy Carter was evaluated in Emergency Department on 10/28/2019 for the symptoms described in the history of present illness. She was evaluated in the context of the global COVID-19 pandemic, which necessitated consideration that the patient might be at risk for infection with the SARS-CoV-2 virus that causes COVID-19. Institutional protocols and algorithms that pertain to the evaluation of patients at risk for COVID-19 are in a state of rapid change based on information released by regulatory bodies including the CDC and federal and state organizations. These policies and algorithms were followed during the patient's care in the ED.   Final Clinical Impression(s) / ED Diagnoses Final diagnoses:  Acute respiratory failure with hypoxia (HCC)  Community acquired pneumonia, unspecified laterality    Rx / DC Orders ED Discharge Orders    None       Eber Hong, MD 10/28/19 1805

## 2019-10-28 NOTE — ED Notes (Signed)
Pt's O2 sats dropped to 82% by standing up.

## 2019-10-28 NOTE — H&P (Addendum)
History and Physical    Brandy Carter NTI:[id number] DOB: Jun 26, 1931 DOA: 10/28/2019  PCP: Medicine, [facility]   Patient coming from: Home  I have personally briefly reviewed patient's old medical records in [facility]  Chief Complaint: Difficulty breathing.  HPI: Brandy Carter is a 86 y.o. female with medical history significant for COPD and asthma on 2 L O2, atrial fibrillation, bronchiectasis, hypertension.  Patient presented to the ED with complaints of difficulty breathing.  She also reports progressive weakness over the past few months.  Patient's son was taking patient for her outpatient follow-up visit, but decided to bring her to the hospital because she was very short of breath.  She endorses wheezing also.   She reports compliance with her medication including torsemide. Hospitalization in March/2020 for parapneumonic effusion requiring chest tube placement, at [facility].  ED Course-tachypneic, temperature 97.5.  Per triage notes O2 sats initially 87% on 2 L, COVID-19 test negative.  Elevation in some inflammatory markers D-dimer at 1.85, CRP and fibrinogen.  BNP 134.  Creatinine mildly elevated at 1.35.  WBC 11.4.  Portable chest x-ray shows suspected bilateral lower lobe bronchiectasis.  Nodular opacities at the bases, could be secondary to nodules, mucous plugging or developing pneumonia.  CT recommended to help clarify.  IV ceftriaxone and azithromycin started in the ED.  Hospitalist to admit for COPD exacerbation and possible pneumonia.  Review of Systems: As per HPI all other systems reviewed and negative.  Past Medical History:  Diagnosis Date  . Allergy   . Anxiety   . Asthma   . Atrial fibrillation with RVR (HCC)   . Bronchiectasis (HCC)   . COPD (chronic obstructive pulmonary disease) (HCC)   . Hypertension   . On home oxygen therapy   . Parapneumonic effusion    s/p chest tube 12/2018  . Severe protein-calorie malnutrition (HCC)    . Shingles    Jan 2020; on back   . SVT (supraventricular tachycardia) (HCC)     Past Surgical History:  Procedure Laterality Date  . APPENDECTOMY    . BREAST SURGERY    . CATARACT EXTRACTION    . PILONIDAL CYST / SINUS EXCISION       reports that she has never smoked. She has never used smokeless tobacco. She reports that she does not drink alcohol or use drugs.  Allergies  Allergen Reactions  . Vibramycin [Doxycycline Calcium]     Family History  Problem Relation Age of Onset  . Hypertension Sister   . Heart disease Brother     Prior to Admission medications   Medication Sig Start Date End Date Taking? Authorizing Provider  acetaminophen (TYLENOL) 500 MG tablet Take 500 mg by mouth every 6 (six) hours as needed.    [provider]  albuterol (PROVENTIL) (2.5 MG/3ML) 0.083% nebulizer solution Take 2.5 mg by nebulization every 4 (four) hours as needed for wheezing or shortness of breath.    [provider]  amiodarone (PACERONE) 200 MG tablet Take 200 mg by mouth daily.  01/07/19   [provider]  cholecalciferol (VITAMIN D3) 25 MCG (1000 UT) tablet Take 1,000 Units by mouth daily.    [provider]  Cyanocobalamin (VITAMIN B 12) 100 MCG LOZG Take 1 tablet by mouth daily.    [provider]  gabapentin (NEURONTIN) 100 MG capsule Take 100 mg by mouth 2 (two) times daily.    [provider]  metoprolol tartrate (LOPRESSOR) 25 MG tablet Take 12.5  mg by mouth 2 (two) times daily.    [provider]  Nutritional Supplements (ENSURE ENLIVE PO) Take 1 Bottle by mouth daily.  01/02/19   [provider]  OXYGEN Inhale 2 L/min into the lungs continuous. 01/02/19   [provider]  pantoprazole (PROTONIX) 40 MG tablet Take 40 mg by mouth daily. 01/07/19   [provider]  potassium chloride SA (K-DUR,KLOR-CON) 20 MEQ tablet Take 20 mEq by mouth daily. 01/07/19   [provider]  predniSONE  (DELTASONE) 10 MG tablet 01/20/2019 - 01/21/2019 give 4 tabs = 40 mg daily x 2 days 01/22/2019 - 01/23/2019 give 3 tabs = 30 mg daily x 2 days 01/24/2019 - 01/25/2019 - give 2 tabs = 20 mg x 2 days 01/26/2019 - 01/27/2019 give 1 tab = 10 mg x 2 days    [provider]  tiotropium (SPIRIVA) 18 MCG inhalation capsule Place 18 mcg into inhaler and inhale daily. 2 puffs inhalation daily 01/15/19   [provider]  torsemide (DEMADEX) 20 MG tablet Take 20 mg by mouth daily. 01/13/19   [provider]    Physical Exam: Vitals:   10/28/19 1830 10/28/19 1845 10/28/19 1900 10/28/19 1926  BP:      Pulse: 89 85 88 94  Resp: (!) 28 (!) 21 (!) 22 (!) 22  Temp:      TempSrc:      SpO2: 96% 96% 96% 96%  Weight:      Height:        Constitutional: Frail-appearing, calm, comfortable Vitals:   10/28/19 1830 10/28/19 1845 10/28/19 1900 10/28/19 1926  BP:      Pulse: 89 85 88 94  Resp: (!) 28 (!) 21 (!) 22 (!) 22  Temp:      TempSrc:      SpO2: 96% 96% 96% 96%  Weight:      Height:       Eyes: PERRL, lids and conjunctivae normal ENMT: Mucous membranes are moist. Posterior pharynx clear of any exudate or lesions.  Neck: normal, supple, no masses, no thyromegaly Respiratory: Mild increased work of breathing, diffuse wheezing, No accessory muscle use.  Cardiovascular: Regular rate and rhythm,  No extremity edema. 2+ pedal pulses.  Abdomen: no tenderness, no masses palpated. No hepatosplenomegaly. Bowel sounds positive.  Musculoskeletal: no clubbing / cyanosis. No joint deformity upper and lower extremities. Good ROM, no contractures. Normal muscle tone.  Skin: no rashes, lesions, ulcers. No induration Neurologic: CN 2-12 grossly intact. Strength 5/5 in all 4.  Psychiatric: Normal judgment and insight. Alert and oriented x 3. Normal mood.   Labs on Admission: I have personally reviewed following labs and imaging studies  CBC: Recent Labs  Lab 10/28/19 1602  WBC 11.4*    NEUTROABS 8.2*  HGB 10.3*  HCT 33.1*  MCV 97.4  PLT 350   Basic Metabolic Panel: Recent Labs  Lab 10/28/19 1602  NA 138  K 4.5  CL 91*  CO2 36*  GLUCOSE 94  BUN 43*  CREATININE 1.35*  CALCIUM 8.9   Liver Function Tests: Recent Labs  Lab 10/28/19 1602  AST 35  ALT 28  ALKPHOS 85  BILITOT 0.5  PROT 7.5  ALBUMIN 3.3*   Lipid Profile: Recent Labs    10/28/19 1603  TRIG 57   Anemia Panel: Recent Labs    10/28/19 1602  FERRITIN 83    Radiological Exams on Admission: DG Chest Port 1 View  Result Date: 10/28/2019 CLINICAL DATA:  Shortness of breath EXAM: PORTABLE CHEST 1 VIEW COMPARISON:  01/15/2019 FINDINGS: Pulmonary hyperinflation. Tubular lucencies in the infrahilar lung suspicious for bronchiectasis. Nodules versus nodular foci of airspace disease at the bases. No pleural effusion or pneumothorax. Stable cardiomediastinal silhouette. Aortic atherosclerosis. IMPRESSION: 1. Hyperinflated lungs 2. Suspected bilateral lower lobe bronchiectasis. Nodular opacities at the bases, could be secondary to nodules, mucous plugging, or developing pneumonia. CT could help to clarify. Electronically Signed   By: Donavan Foil M.D.   On: 10/28/2019 15:29    EKG: Pending  Assessment/Plan Active Problems:   COPD exacerbation (HCC)   COPD/asthma exacerbation- with Acute on chronic respiratory failure, sats 87% on home 2 L.  Wheezing on exam.  Respiratory virus panel negative for COVID-19 infection and influenza. -Solu-Medrol 40 mg every 12 hourly (lower dose considering a BMI of 13). -Continue IV antibiotic ceftriaxone and azithromycin  -DuoNeb scheduled and as needed -As needed mucolytics, supplemental O2  Possible pneumonia- dyspnea with acute on chronic respiratory failure.  Procalcitonin less than 0.1.  Chest x-ray shows suspected bilateral lower lobe bronchiectasis, possible developing pneumonia.  CT recommended.  COVID-19 negative.  But elevation in D-dimer to 1.85, and  other inflammatory markers- fibrinogen, CRP, BNP.  No sign of peripheral edema.  Low suspicion for PE at this time despite elevated D-dimer, which could be explained by pneumonia. -Obtain CT chest without contrast -IV ceftriaxone and azithromycin -CBC, BMP a.m. -Addendum-chest CT showing bronchiectasis with widespread bronchial secretions and distal bronchial obstruction.  Bilateral upper lung predominant homogeneous abnormal groundglass opacity.  Suspect acute on chronic viral/atypical respiratory infection.  Consider ED MAI among the atypical agents. -Will continue COVID-19 isolation for now -Repeat Covid testing also considering CT chest findings and elevation in some inflammatory markers.  Acute on chronic kidney injury, creatinine 1.35, BNP 43.  Baseline creatinine 0.8-1.  Patient appears euvolemic to slightly on the dry side. -Home torsemide for now.  Atrial fibrillation-rate controlled.  Not on anticoagulation.  Patient developed A. fib with RVR a prolonged hospitalization at [facility] in March/2020, she was placed on Eliquis but this was discontinued due to concerns for GI bleed. -Resume home amiodarone  Hypertension- -hold home torsemide 20 mg daily for now   Generalized weakness-worse over the past several months.  Likely due to increasing frailty advancing age .-PT evaluation -Check TSH   DVT prophylaxis: Lovenox Code Status: Full code Family Communication: None at bedside Disposition Plan: Per rounding team Consults called: None Admission status: Observation, telemetry   Kosta Schnitzler Arlyce Dice MD [facility]  10/28/2019, 7:36 PM

## 2019-10-28 NOTE — ED Triage Notes (Signed)
Patient reports feeling weak "for months." States she was on her way to PCP this am and had increased SOB. Head bobbing and accessory muscle use noted in Triage. Patient 87% on 2 L.

## 2019-10-29 DIAGNOSIS — J9611 Chronic respiratory failure with hypoxia: Secondary | ICD-10-CM

## 2019-10-29 DIAGNOSIS — I4891 Unspecified atrial fibrillation: Secondary | ICD-10-CM

## 2019-10-29 DIAGNOSIS — E43 Unspecified severe protein-calorie malnutrition: Secondary | ICD-10-CM

## 2019-10-29 DIAGNOSIS — J129 Viral pneumonia, unspecified: Secondary | ICD-10-CM | POA: Diagnosis present

## 2019-10-29 DIAGNOSIS — J441 Chronic obstructive pulmonary disease with (acute) exacerbation: Secondary | ICD-10-CM

## 2019-10-29 LAB — C-REACTIVE PROTEIN: CRP: 8.8 mg/dL — ABNORMAL HIGH (ref <1.0)

## 2019-10-29 LAB — CBC
HCT: 34.1 % — ABNORMAL LOW (ref 36.0–46.0)
Hemoglobin: 10.7 g/dL — ABNORMAL LOW (ref 12.0–15.0)
MCH: 30.3 pg (ref 26.0–34.0)
MCHC: 31.4 g/dL (ref 30.0–36.0)
MCV: 96.6 fL (ref 80.0–100.0)
Platelets: 350 10*3/uL (ref 150–400)
RBC: 3.53 MIL/uL — ABNORMAL LOW (ref 3.87–5.11)
RDW: 13.3 % (ref 11.5–15.5)
WBC: 9.5 10*3/uL (ref 4.0–10.5)
nRBC: 0 % (ref 0.0–0.2)

## 2019-10-29 LAB — BASIC METABOLIC PANEL
Anion gap: 11 (ref 5–15)
BUN: 39 mg/dL — ABNORMAL HIGH (ref 8–23)
CO2: 35 mmol/L — ABNORMAL HIGH (ref 22–32)
Calcium: 9.1 mg/dL (ref 8.9–10.3)
Chloride: 92 mmol/L — ABNORMAL LOW (ref 98–111)
Creatinine, Ser: 1.51 mg/dL — ABNORMAL HIGH (ref 0.44–1.00)
GFR calc Af Amer: 36 mL/min — ABNORMAL LOW (ref >60)
GFR calc non Af Amer: 31 mL/min — ABNORMAL LOW (ref >60)
Glucose, Bld: 135 mg/dL — ABNORMAL HIGH (ref 70–99)
Potassium: 5.4 mmol/L — ABNORMAL HIGH (ref 3.5–5.1)
Sodium: 138 mmol/L (ref 135–145)

## 2019-10-29 LAB — FERRITIN: Ferritin: 89 ng/mL (ref 11–307)

## 2019-10-29 LAB — D-DIMER, QUANTITATIVE: D-Dimer, Quant: 1.87 ug/mL-FEU — ABNORMAL HIGH (ref 0.00–0.50)

## 2019-10-29 LAB — GLUCOSE, CAPILLARY: Glucose-Capillary: 224 mg/dL — ABNORMAL HIGH (ref 70–99)

## 2019-10-29 LAB — SARS CORONAVIRUS 2 (TAT 6-24 HRS): SARS Coronavirus 2: NEGATIVE

## 2019-10-29 MED ORDER — ALPRAZOLAM 0.25 MG PO TABS
0.2500 mg | ORAL_TABLET | Freq: Every evening | ORAL | Status: DC | PRN
  Administered 2019-10-29: 0.25 mg via ORAL
  Filled 2019-10-29: qty 1

## 2019-10-29 MED ORDER — FUROSEMIDE 10 MG/ML IJ SOLN
20.0000 mg | Freq: Two times a day (BID) | INTRAMUSCULAR | Status: DC
  Administered 2019-10-29 – 2019-10-30 (×2): 20 mg via INTRAVENOUS
  Filled 2019-10-29 (×2): qty 2

## 2019-10-29 MED ORDER — ENSURE ENLIVE PO LIQD
237.0000 mL | Freq: Two times a day (BID) | ORAL | Status: DC
  Filled 2019-10-29 (×2): qty 237

## 2019-10-29 NOTE — ED Notes (Signed)
PT was given meal tray.

## 2019-10-29 NOTE — Plan of Care (Signed)
  Problem: Acute Rehab PT Goals(only PT should resolve) Goal: Pt Will Go Supine/Side To Sit Outcome: Progressing Flowsheets (Taken 10/29/2019 1211) Pt will go Supine/Side to Sit: Independently Goal: Patient Will Transfer Sit To/From Stand Outcome: Progressing Flowsheets (Taken 10/29/2019 1211) Patient will transfer sit to/from stand: with supervision Goal: Pt Will Transfer Bed To Chair/Chair To Bed Outcome: Progressing Flowsheets (Taken 10/29/2019 1211) Pt will Transfer Bed to Chair/Chair to Bed: with supervision Goal: Pt Will Ambulate Outcome: Progressing Flowsheets (Taken 10/29/2019 1211) Pt will Ambulate:  75 feet  with supervision  with rolling walker  with cane   12:12 PM, 10/29/19 Ocie Bob, MPT Physical Therapist with [facility] 336 [phone] office [phone] mobile phone

## 2019-10-29 NOTE — Progress Notes (Addendum)
PROGRESS NOTE  Brandy Carter LKG:[id number] DOB: 1931-10-07 DOA: 10/28/2019 PCP: Medicine, [facility]  HPI/Recap of past 46 hours: 86 year old female with past medical history of asthma and COPD on chronic 2 L nasal cannula with atrial fibrillation came in with complaints of shortness of breath times several days on 1/13.  She has been doing well, isolating at home.  In the emergency room, her initial Covid test was negative, but she was noted to have elevations in her inflammatory markers including a D-dimer of 1.85 and CRP of 8.  Chest x-ray noted suspected bilateral lower lobe bronchiectasis, but a CT scan noted no evidence of PE, but did note bilateral upper lobe groundglass opacities.  This was highly suspicious for Covid, so she had a repeat Covid test done approximately 8 hours later which came back today also negative.  Procalcitonin level normal and a minimally elevated BNP.  Patient herself feeling about the same.  Still continued cough.  She was given Rocephin and Zithromax.    Assessment/Plan: Active Problems:   Atrial fibrillation with RVR (HCC): Rate controlled on amiodarone.  Not on anticoagulation, discontinued after GI bleed.    Chronic respiratory failure with hypoxia (HCC) COPD exacerbation: Minimally elevated BNP may be secondary to COPD.  Will give mild IV Lasix.  She is normally on Demadex.  Treating with steroids and for now we will continue antibiotics.  With 2 - Covid test I do not think that she does have Covid, but certainly curious.  We will continue steroids as well.  Follow CRP and when it comes down notably, could discharge her home.  We will also check additional respiratory panel    Essential hypertension: Continue home meds.    Chronic anxiety: Continue home meds  AKI: Noted creatinine around 1 nine months ago.  Could be from Demadex.  Monitor closely as we are giving Lasix.    Severe protein-calorie malnutrition (HCC): Nutrition to see.   BMI of 13.7.  Have started Ensure twice daily  Code Status: Full code  Family Communication: Updated son by phone  Disposition Plan: Potentially home tomorrow, would like to see CRP much lower,     Consultants:  None   Procedures:  None   Antimicrobials:  IV Rocephin and Zithromax 1/13-present  DVT prophylaxis: Lovenox   Objective: Vitals:   10/29/19 0830 10/29/19 1300  BP: 108/60 (!) 152/77  Pulse: 70 79  Resp: 14 (!) 21  Temp:    SpO2: 97% 92%    Intake/Output Summary (Last 24 hours) at 10/29/2019 1609 Last data filed at 10/28/2019 1915 Gross per 24 hour  Intake 100 ml  Output --  Net 100 ml   Filed Weights   10/28/19 1434  Weight: 38.6 kg   Body mass index is 13.72 kg/m.  Exam:   General: Alert and oriented x3, no acute distress  Cardiovascular: Regular rate and rhythm, S1-S2  Respiratory: Decreased breath sounds throughout  Abdomen: Soft, nontender, nondistended, positive bowel sounds  Musculoskeletal: No clubbing or cyanosis or edema  Skin: No skin breaks, tears or lesions  Psychiatry: Appropriate, no evidence of psychoses   Data Reviewed: CBC: Recent Labs  Lab 10/28/19 1602 10/29/19 0344  WBC 11.4* 9.5  NEUTROABS 8.2*  --   HGB 10.3* 10.7*  HCT 33.1* 34.1*  MCV 97.4 96.6  PLT 350 350   Basic Metabolic Panel: Recent Labs  Lab 10/28/19 1602 10/29/19 0344  NA 138 138  K 4.5 5.4*  CL 91* 92*  CO2 36* 35*  GLUCOSE 94 135*  BUN 43* 39*  CREATININE 1.35* 1.51*  CALCIUM 8.9 9.1   GFR: Estimated Creatinine Clearance: 16.3 mL/min (A) (by C-G formula based on SCr of 1.51 mg/dL (H)). Liver Function Tests: Recent Labs  Lab 10/28/19 1602  AST 35  ALT 28  ALKPHOS 85  BILITOT 0.5  PROT 7.5  ALBUMIN 3.3*   No results for input(s): LIPASE, AMYLASE in the last 168 hours. No results for input(s): AMMONIA in the last 168 hours. Coagulation Profile: No results for input(s): INR, PROTIME in the last 168 hours. Cardiac  Enzymes: No results for input(s): CKTOTAL, CKMB, CKMBINDEX, TROPONINI in the last 168 hours. BNP (last 3 results) No results for input(s): PROBNP in the last 8760 hours. HbA1C: No results for input(s): HGBA1C in the last 72 hours. CBG: No results for input(s): GLUCAP in the last 168 hours. Lipid Profile: Recent Labs    10/28/19 1603  TRIG 57   Thyroid Function Tests: Recent Labs    10/28/19 1602  TSH 23.318*   Anemia Panel: Recent Labs    10/28/19 1602 10/29/19 1036  FERRITIN 83 89   Urine analysis: No results found for: COLORURINE, APPEARANCEUR, LABSPEC, PHURINE, GLUCOSEU, HGBUR, BILIRUBINUR, KETONESUR, PROTEINUR, UROBILINOGEN, NITRITE, LEUKOCYTESUR Sepsis Labs: @LABRCNTIP (procalcitonin:4,lacticidven:4)  ) Recent Results (from the past 240 hour(s))  Respiratory Panel by RT PCR (Flu A&B, Covid) - Nasopharyngeal Swab     Status: None   Collection Time: 10/28/19  3:15 PM   Specimen: Nasopharyngeal Swab  Result Value Ref Range Status   SARS Coronavirus 2 by RT PCR NEGATIVE NEGATIVE Final    Comment: (NOTE) SARS-CoV-2 target nucleic acids are NOT DETECTED. The SARS-CoV-2 RNA is generally detectable in upper respiratoy specimens during the acute phase of infection. The lowest concentration of SARS-CoV-2 viral copies this assay can detect is 131 copies/mL. A negative result does not preclude SARS-Cov-2 infection and should not be used as the sole basis for treatment or other patient management decisions. A negative result may occur with  improper specimen collection/handling, submission of specimen other than nasopharyngeal swab, presence of viral mutation(s) within the areas targeted by this assay, and inadequate number of viral copies (<131 copies/mL). A negative result must be combined with clinical observations, patient history, and epidemiological information. The expected result is Negative. Fact Sheet for Patients:   10/30/19 Fact Sheet for Healthcare Providers:  https://www.moore.com/ This test is not yet ap proved or cleared by the https://www.young.biz/ FDA and  has been authorized for detection and/or diagnosis of SARS-CoV-2 by FDA under an Emergency Use Authorization (EUA). This EUA will remain  in effect (meaning this test can be used) for the duration of the COVID-19 declaration under Section 564(b)(1) of the Act, 21 U.S.C. section 360bbb-3(b)(1), unless the authorization is terminated or revoked sooner.    Influenza A by PCR NEGATIVE NEGATIVE Final   Influenza B by PCR NEGATIVE NEGATIVE Final    Comment: (NOTE) The Xpert Xpress SARS-CoV-2/FLU/RSV assay is intended as an aid in  the diagnosis of influenza from Nasopharyngeal swab specimens and  should not be used as a sole basis for treatment. Nasal washings and  aspirates are unacceptable for Xpert Xpress SARS-CoV-2/FLU/RSV  testing. Fact Sheet for Patients: [place] Fact Sheet for Healthcare Providers: https://www.moore.com/ This test is not yet approved or cleared by the https://www.young.biz/ FDA and  has been authorized for detection and/or diagnosis of SARS-CoV-2 by  FDA under an Emergency Use Authorization (EUA). This EUA will remain  in effect (meaning this test can be used) for the duration of the  Covid-19 declaration under Section 564(b)(1) of the Act, 21  U.S.C. section 360bbb-3(b)(1), unless the authorization is  terminated or revoked. Performed at [facility], [address] [place], [place] [address]   Blood Culture (routine x 2)     Status: None (Preliminary result)   Collection Time: 10/28/19  4:02 PM   Specimen: Left Antecubital; Blood  Result Value Ref Range Status   Specimen Description LEFT ANTECUBITAL  Final   Special Requests   Final    BOTTLES DRAWN AEROBIC AND ANAEROBIC Blood Culture adequate volume   Culture   Final     NO GROWTH < 24 HOURS Performed at [facility], [address], [place], [place] [address]    Report Status PENDING  Incomplete  Blood Culture (routine x 2)     Status: None (Preliminary result)   Collection Time: 10/28/19  4:03 PM   Specimen: Right Antecubital; Blood  Result Value Ref Range Status   Specimen Description RIGHT ANTECUBITAL  Final   Special Requests   Final    BOTTLES DRAWN AEROBIC AND ANAEROBIC Blood Culture adequate volume   Culture   Final    NO GROWTH < 24 HOURS Performed at [facility], [address] [place], [place] [address]    Report Status PENDING  Incomplete  SARS CORONAVIRUS 2 (TAT 6-24 HRS) Nasopharyngeal Nasopharyngeal Swab     Status: None   Collection Time: 10/28/19 10:13 PM   Specimen: Nasopharyngeal Swab  Result Value Ref Range Status   SARS Coronavirus 2 NEGATIVE NEGATIVE Final    Comment: (NOTE) SARS-CoV-2 target nucleic acids are NOT DETECTED. The SARS-CoV-2 RNA is generally detectable in upper and lower respiratory specimens during the acute phase of infection. Negative results do not preclude SARS-CoV-2 infection, do not rule out co-infections with other pathogens, and should not be used as the sole basis for treatment or other patient management decisions. Negative results must be combined with clinical observations, patient history, and epidemiological information. The expected result is Negative. Fact Sheet for Patients: HairSlick.no Fact Sheet for Healthcare Providers: quierodirigir.com This test is not yet approved or cleared by the [place] FDA and  has been authorized for detection and/or diagnosis of SARS-CoV-2 by FDA under an Emergency Use Authorization (EUA). This EUA will remain  in effect (meaning this test can be used) for the duration of the COVID-19 declaration under Section 56 4(b)(1) of the Act, 21 U.S.C. section 360bbb-3(b)(1), unless the authorization is  terminated or revoked sooner. Performed at [facility], [address], [place], [place] [address]       Studies: CT CHEST WO CONTRAST  Result Date: 10/28/2019 CLINICAL DATA:  86 year old female with shortness of breath, progressive weakness. Negative for COVID-19 today. EXAM: CT CHEST WITHOUT CONTRAST TECHNIQUE: Multidetector CT imaging of the chest was performed following the standard protocol without IV contrast. COMPARISON:  Portable chest earlier tonight. FINDINGS: Cardiovascular: Vascular patency is not evaluated in the absence of IV contrast. Calcified coronary artery atherosclerosis. Calcified aortic atherosclerosis. No cardiomegaly or pericardial effusion. Mediastinum/Nodes: Negative.  No lymphadenopathy. Lungs/Pleura: Trachea and carina are patent. The right mainstem is patent but there is subtotal opacification of the bronchus intermedius with fluid or secretions (series 4, image 73). Beyond the hila there are numerous areas of opacified small airways (series 4, images 80 and 87) with underlying bronchiectasis and peribronchial thickening. The findings are especially pronounced in the basal segments  of the lower lobes and the right middle lobe. Superimposed widespread bilateral upper lobe uniform ground-glass opacity. Similar appearance in the superior segments of the lower lobes more so the right. No areas of consolidation.  No pleural effusion. Upper Abdomen: Negative noncontrast visible liver, spleen, pancreas, adrenal glands, kidneys and bowel. Musculoskeletal: Osteopenia. Mild to moderate T12 superior endplate compression with retropulsion of bone (sagittal image 73) resulting in mild spinal stenosis. The T12 posterior elements appear to remain intact. This is age indeterminate, but probably nonacute given superior endplate sclerosis. No other acute osseous abnormality. IMPRESSION: 1. Pulmonary hyperinflation and Bronchiectasis with widespread bronchial secretions and distal  bronchial obstruction. Bilateral upper lung predominant homogeneous abnormal ground-glass opacity. Suspect acute on chronic viral/atypical respiratory infection. Consider MAI among other atypical agents. 2. No pleural effusion or mediastinal lymphadenopathy. 3. Calcified coronary artery atherosclerosis and Aortic Atherosclerosis (ICD10-I70.0). 4. Osteopenia. Age indeterminate but probably chronic T12 compression fracture with retropulsion of bone resulting in mild spinal stenosis. Electronically Signed   By: Genevie Ann M.D.   On: 10/28/2019 20:43    Scheduled Meds: . amiodarone  200 mg Oral Daily  . enoxaparin (LOVENOX) injection  30 mg Subcutaneous Q24H  . furosemide  20 mg Intravenous Q12H  . methylPREDNISolone (SOLU-MEDROL) injection  40 mg Intravenous Q12H  . metoprolol tartrate  12.5 mg Oral BID    Continuous Infusions: . azithromycin    . cefTRIAXone (ROCEPHIN)  IV       LOS: 1 day     Annita Brod, MD [facility]  To reach me or the doctor on call, go to: [email or web address] Password [facility]  10/29/2019, 4:09 PM

## 2019-10-29 NOTE — Evaluation (Signed)
Physical Therapy Evaluation Patient Details Name: Brandy Carter MRN: [id number] DOB: 1930/10/18 Today's Date: 10/29/2019   History of Present Illness  Brandy Carter is a 86 y.o. female with medical history significant for COPD and asthma on 2 L O2, atrial fibrillation, bronchiectasis, hypertension.  Patient presented to the ED with complaints of difficulty breathing.  She also reports progressive weakness over the past few months.  Patient's son was taking patient for her outpatient follow-up visit, but decided to bring her to the hospital because she was very short of breath.  She endorses wheezing also.      Clinical Impression  Patient functioning near baseline for functional mobility and gait, requires the use of RW due to unsteadiness on feet when attempting to take steps without AD requiring hand held assistance, safer using RW and tolerated walking in room for approximately 6-7 minutes before fatiguing with SpO2 dropping from 91% to 86% on 2 LPM.  Patient put back on 3 LPM with SpO2 increasing above 92% after therapy.  Patient put back to bed - RN notified.  Patient will benefit from continued physical therapy in hospital and recommended venue below to increase strength, balance, endurance for safe ADLs and gait.     Follow Up Recommendations Home health PT;Supervision - Intermittent;Supervision for mobility/OOB    Equipment Recommendations  None recommended by PT    Recommendations for Other Services       Precautions / Restrictions Precautions Precautions: Fall Restrictions Weight Bearing Restrictions: No      Mobility  Bed Mobility Overal bed mobility: Modified Independent             General bed mobility comments: increased time  Transfers Overall transfer level: Needs assistance Equipment used: Rolling walker (2 wheeled);1 person hand held assist Transfers: Sit to/from [facility] Sit to Stand: Min guard Stand pivot transfers: Min guard        General transfer comment: Required Min assist hand held assistance when not using an AD, safer using RW  Ambulation/Gait Ambulation/Gait assistance: Min guard Gait Distance (Feet): 40 Feet Assistive device: 4-wheeled walker Gait Pattern/deviations: Decreased step length - right;Decreased step length - left;Decreased stride length Gait velocity: decreased   General Gait Details: slightly labored cadence without loss of balance, limited secondary to fatigue, on 2 LPM with SpO2 dropping from 91% to 86%  Stairs            Wheelchair Mobility    Modified Rankin (Stroke Patients Only)       Balance Overall balance assessment: Needs assistance Sitting-balance support: No upper extremity supported;Feet supported Sitting balance-Leahy Scale: Good Sitting balance - Comments: seated at EOB   Standing balance support: Bilateral upper extremity supported;During functional activity Standing balance-Leahy Scale: Fair Standing balance comment: using RW                             Pertinent Vitals/Pain Pain Assessment: No/denies pain    Home Living Family/patient expects to be discharged to:: Private residence Living Arrangements: Children Available Help at Discharge: Family;Available PRN/intermittently Type of Home: House Home Access: Stairs to enter Entrance Stairs-Rails: None Entrance Stairs-Number of Steps: 2 Home Layout: Two level;Able to live on main level with bedroom/bathroom Home Equipment: Gilford Rile - 2 wheels;Cane - single point;Shower seat      Prior Function Level of Independence: Independent         Comments: Systems developer  Extremity/Trunk Assessment   Upper Extremity Assessment Upper Extremity Assessment: Overall WFL for tasks assessed    Lower Extremity Assessment Lower Extremity Assessment: Generalized weakness    Cervical / Trunk Assessment Cervical / Trunk Assessment: Kyphotic  Communication    Communication: No difficulties  Cognition Arousal/Alertness: Awake/alert Behavior During Therapy: WFL for tasks assessed/performed Overall Cognitive Status: Within Functional Limits for tasks assessed                                        General Comments      Exercises     Assessment/Plan    PT Assessment Patient needs continued PT services  PT Problem List Decreased strength;Decreased activity tolerance;Decreased balance;Decreased mobility       PT Treatment Interventions DME instruction;Gait training;Stair training;Functional mobility training;Therapeutic activities;Therapeutic exercise;Patient/family education    PT Goals (Current goals can be found in the Care Plan section)  Acute Rehab PT Goals Patient Stated Goal: Return home with family to assist(return home with family to assist) PT Goal Formulation: With patient Time For Goal Achievement: 11/05/19 Potential to Achieve Goals: Good    Frequency Min 3X/week   Barriers to discharge        Co-evaluation               AM-PAC PT "6 Clicks" Mobility  Outcome Measure Help needed turning from your back to your side while in a flat bed without using bedrails?: None Help needed moving from lying on your back to sitting on the side of a flat bed without using bedrails?: None Help needed moving to and from a bed to a chair (including a wheelchair)?: A Little Help needed standing up from a chair using your arms (e.g., wheelchair or bedside chair)?: A Little Help needed to walk in hospital room?: A Little Help needed climbing 3-5 steps with a railing? : A Lot 6 Click Score: 19    End of Session Equipment Utilized During Treatment: Oxygen Activity Tolerance: Patient tolerated treatment well;Patient limited by fatigue Patient left: in bed;with call bell/phone within reach Nurse Communication: Mobility status PT Visit Diagnosis: Unsteadiness on feet (R26.81);Other abnormalities of gait and mobility  (R26.89);Muscle weakness (generalized) (M62.81)    Time: 5701-7793 PT Time Calculation (min) (ACUTE ONLY): 34 min   Charges:   PT Evaluation $PT Eval Moderate Complexity: 1 Mod PT Treatments $Therapeutic Activity: 23-37 mins        12:10 PM, 10/29/19 Ocie Bob, MPT Physical Therapist with [facility] [phone] office [phone] mobile phone

## 2019-10-30 DIAGNOSIS — I10 Essential (primary) hypertension: Secondary | ICD-10-CM

## 2019-10-30 DIAGNOSIS — J9621 Acute and chronic respiratory failure with hypoxia: Secondary | ICD-10-CM

## 2019-10-30 DIAGNOSIS — J129 Viral pneumonia, unspecified: Principal | ICD-10-CM

## 2019-10-30 LAB — RESPIRATORY PANEL BY PCR

## 2019-10-30 LAB — BASIC METABOLIC PANEL
Anion gap: 12 (ref 5–15)
BUN: 40 mg/dL — ABNORMAL HIGH (ref 8–23)
CO2: 33 mmol/L — ABNORMAL HIGH (ref 22–32)
Calcium: 8.5 mg/dL — ABNORMAL LOW (ref 8.9–10.3)
Chloride: 92 mmol/L — ABNORMAL LOW (ref 98–111)
Creatinine, Ser: 1.18 mg/dL — ABNORMAL HIGH (ref 0.44–1.00)
GFR calc Af Amer: 48 mL/min — ABNORMAL LOW (ref >60)
GFR calc non Af Amer: 42 mL/min — ABNORMAL LOW (ref >60)
Glucose, Bld: 133 mg/dL — ABNORMAL HIGH (ref 70–99)
Potassium: 4.2 mmol/L (ref 3.5–5.1)
Sodium: 137 mmol/L (ref 135–145)

## 2019-10-30 LAB — C-REACTIVE PROTEIN: CRP: 5 mg/dL — ABNORMAL HIGH (ref <1.0)

## 2019-10-30 LAB — GLUCOSE, CAPILLARY: Glucose-Capillary: 130 mg/dL — ABNORMAL HIGH (ref 70–99)

## 2019-10-30 MED ORDER — ENSURE ENLIVE PO LIQD: 1.0000 | Freq: Two times a day (BID) | ORAL | 12 refills | Status: AC

## 2019-10-30 MED ORDER — PREDNISONE 10 MG PO TABS: ORAL_TABLET | ORAL | 0 refills | Status: AC

## 2019-10-30 MED ORDER — TRAMADOL HCL 50 MG PO TABS
50.0000 mg | ORAL_TABLET | Freq: Two times a day (BID) | ORAL | Status: DC | PRN
  Administered 2019-10-30: 50 mg via ORAL
  Filled 2019-10-30: qty 1

## 2019-10-30 MED ORDER — TIOTROPIUM BROMIDE MONOHYDRATE 18 MCG IN CAPS: 18.0000 ug | ORAL_CAPSULE | Freq: Every day | RESPIRATORY_TRACT | 12 refills | Status: DC

## 2019-10-30 NOTE — Progress Notes (Addendum)
Initial Nutrition Assessment  DOCUMENTATION CODES:   Severe malnutrition in context of chronic illness  INTERVENTION:  Ensure Enlive po BID, each supplement provides 350 kcal and 20 grams of protein   Malnutrition education handout attached with discharge instructions  NUTRITION DIAGNOSIS:   Severe Malnutrition related to chronic illness(COPD) as evidenced by severe muscle depletion, severe fat depletion.   GOAL:   Patient will meet greater than or equal to 90% of their needs MONITOR:   Weight trends, PO intake, Supplement acceptance, Skin, Labs, I & O's REASON FOR ASSESSMENT:   Consult Assessment of nutrition requirement/status  ASSESSMENT: Patient is an underweight 86 yo female who presents shortness of breath and ongoing weakness. History of COPD and chronic O2@2  L. Currently she is living with her son.   Patient ate 50% of her breakfast this morning. Feeds herself. At home she is "trying to eat" more. Patient aware of her malnutrition diagnosis, her need for increased calorie and protein intake. She is drinking Boost daily with her meds and eating 3 small meals daily.   Weight loss of 12% the past 9 months. Severe loss of muscle and fat mass. Chronic malnutrition.  Labs reviewed: CRP improved from 8.8->5.0, Ferritin -wnl. BUN 40, Cr 1.18. CBG's-224-130 mg/dl.  Medications reviewed and include: solumedrol, lopressor.  Drips: Zithromax, Rocephin   Intake/Output Summary (Last 24 hours) at 10/30/2019 1246 Last data filed at 10/30/2019 0900 Gross per 24 hour  Intake 670 ml  Output 200 ml  Net 470 ml     NUTRITION - FOCUSED PHYSICAL EXAM: Nutrition-Focused physical exam completed. Findings are severe fat depletion (upper arms -triceps, thoracic), severe muscle depletion (dorsal, clavicle, acromion process, scapula), and no edema.     Diet Order:   Diet Order            Diet heart healthy/carb modified Room service appropriate? Yes; Fluid consistency: Thin  Diet  effective now              EDUCATION NEEDS:  Education needs have been addressed  Skin:  Skin Assessment: Reviewed RN Assessment  Last BM:  1/13  Height:   Ht Readings from Last 1 Encounters:  10/29/19 5\' 6"  (1.676 m)    Weight:   Wt Readings from Last 1 Encounters:  10/29/19 38.4 kg    Ideal Body Weight:   59 kg  BMI:  Body mass index is 13.66 kg/m.  Estimated Nutritional Needs:   Kcal:  10/31/19  Protein:  53-61 gr  Fluid:  >1100 ml daily   [id number] MS,RD,CSG,LDN Office: [phone] Pager: [phone]

## 2019-10-30 NOTE — Discharge Summary (Signed)
Discharge Summary  Brandy Carter RWE:[id number] DOB: 1930/10/16  PCP: Medicine, [facility]  Admit date: 10/28/2019 Discharge date: 10/30/2019  Time spent: 25 minutes  Recommendations for Outpatient Follow-up:  1. New medication: New prednisone taper. 2. Patient getting a refill and encouraged to restart her Spiriva. 3. Increasing Ensure from once a day to twice a day between meals  Discharge Diagnoses:  Active Hospital Problems   Diagnosis Date Noted  . Viral pneumonia 10/29/2019  . COPD exacerbation (HCC) 10/28/2019  . Essential hypertension 01/02/2019  . Chronic anxiety 01/02/2019  . Atrial fibrillation with RVR (HCC) 01/02/2019  . Chronic respiratory failure with hypoxia (HCC) 01/02/2019  . Severe protein-calorie malnutrition (HCC) 01/02/2019    Resolved Hospital Problems  No resolved problems to display.    Discharge Condition: Improved, being discharged home  Diet recommendation: Regular diet with Ensure Enlive twice daily between meals  Vitals:   10/29/19 2101 10/30/19 0520  BP: 126/64 (!) 120/59  Pulse: 80 67  Resp: 20 16  Temp: 98.4 F (36.9 C) 97.7 F (36.5 C)  SpO2: 93% 100%    History of present illness:  86 year old female with past medical history of asthma and COPD on chronic 2 L nasal cannula with atrial fibrillation came in with complaints of shortness of breath times several days on 1/13.  She has been doing well, isolating at home.  In the emergency room, her initial Covid test was negative, but she was noted to have elevations in her inflammatory markers including a D-dimer of 1.85 and CRP of 8.  Chest x-ray noted suspected bilateral lower lobe bronchiectasis, but a CT scan noted no evidence of PE, but did note bilateral upper lobe groundglass opacities.  This was highly suspicious for Covid, so she had a repeat Covid test done approximately 8 hours later which came back today also negative.  Procalcitonin level normal and a  minimally elevated BNP.   Hospital Course:    Atrial fibrillation with RVR [facility]): Rate controlled on amiodarone.  Not on anticoagulation, discontinued previously after an episode of a GI bleed.    COPD exacerbation and viral pneumonia in the setting of acute on chronic respiratory failure with hypoxia (HCC): Minimally elevated BNP may be secondary to COPD.  Will give mild IV Lasix.  She is normally on Demadex.  Treating with steroids and for now we will continue antibiotics.  With 2 - Covid test I do not think that she does have Covid, but likely that she has another viral pneumonia.  Have ordered a full respiratory panel, with results pending.  She did respond to steroids well and by day of discharge, CRP had come down to 5.  We will plan to discharge on extended steroid taper.  Was able to wean her oxygen down from 3 L down to her baseline of 2 L..   Deconditioning: Seen by physical therapy who recommended home health PT which we are setting up.    Essential hypertension: Continue home meds.    Chronic anxiety: Continue home meds  AKI in the setting of stage III chronic kidney disease: Noted creatinine around 1 nine months ago.  Could be from Demadex.    Evening after getting 2 doses of IV Lasix here during her hospitalization, her creatinine improved and was at 1.18 by time of discharge.  Severe protein-calorie malnutrition (HCC): Seen by nutrition.  Patient meets criteria for severe malnutrition is related to her chronic COPD and evidenced by severe muscle and fat depletion.  BMI of 13.7.  Have started Ensure twice daily  Procedures:  None  Consultations:  None  Discharge Exam: BP (!) 120/59 (BP Location: Right Arm)   Pulse 67   Temp 97.7 F (36.5 C) (Oral)   Resp 16   Ht 5\' 6"  (1.676 m)   Wt 38.4 kg   SpO2 100%   BMI 13.66 kg/m   General: Alert and oriented x3, no acute distress Cardiovascular: Irregular rhythm, rate controlled Respiratory: Decreased breath sounds  throughout  Discharge Instructions You were cared for by a hospitalist during your hospital stay. If you have any questions about your discharge medications or the care you received while you were in the hospital after you are discharged, you can call the unit and asked to speak with the hospitalist on call if the hospitalist that took care of you is not available. Once you are discharged, your primary care physician will handle any further medical issues. Please note that NO REFILLS for any discharge medications will be authorized once you are discharged, as it is imperative that you return to your primary care physician (or establish a relationship with a primary care physician if you do not have one) for your aftercare needs so that they can reassess your need for medications and monitor your lab values.  Discharge Instructions    Diet - low sodium heart healthy   Complete by: As directed    Increase activity slowly   Complete by: As directed      Allergies as of 10/30/2019      Reactions   Vibramycin [doxycycline Calcium]       Medication List    TAKE these medications   acetaminophen 500 MG tablet Commonly known as: TYLENOL Take 500 mg by mouth every 6 (six) hours as needed for mild pain.   albuterol (2.5 MG/3ML) 0.083% nebulizer solution Commonly known as: PROVENTIL Take 2.5 mg by nebulization every 4 (four) hours as needed for wheezing or shortness of breath.   albuterol 108 (90 Base) MCG/ACT inhaler Commonly known as: VENTOLIN HFA Inhale into the lungs.   ALPRAZolam 0.25 MG tablet Commonly known as: XANAX Take 0.25 mg by mouth at bedtime.   amiodarone 200 MG tablet Commonly known as: PACERONE Take 200 mg by mouth daily.   cholecalciferol 25 MCG (1000 UNIT) tablet Commonly known as: VITAMIN D3 Take 1,000 Units by mouth daily.   feeding supplement (ENSURE ENLIVE) Liqd Take 237 mLs by mouth 2 (two) times daily between meals. What changed:   how much to take  when  to take this   gabapentin 100 MG capsule Commonly known as: NEURONTIN Take 100 mg by mouth 2 (two) times daily.   levothyroxine 25 MCG tablet Commonly known as: SYNTHROID Take 25 mcg by mouth daily.   metoprolol tartrate 25 MG tablet Commonly known as: LOPRESSOR Take 12.5 mg by mouth 2 (two) times daily.   OXYGEN Inhale 2 L/min into the lungs continuous.   pantoprazole 40 MG tablet Commonly known as: PROTONIX Take 40 mg by mouth daily.   potassium chloride SA 20 MEQ tablet Commonly known as: KLOR-CON Take 20 mEq by mouth daily.   predniSONE 10 MG tablet Commonly known as: DELTASONE Take 5 tablets (50 mg total) by mouth daily for 2 days, THEN 4 tablets (40 mg total) daily for 2 days, THEN 3 tablets (30 mg total) daily for 2 days, THEN 2 tablets (20 mg total) daily for 2 days, THEN 1 tablet (10 mg total) daily for  2 days. Start taking on: October 30, 2019 What changed: See the new instructions.   tiotropium 18 MCG inhalation capsule Commonly known as: SPIRIVA Place 1 capsule (18 mcg total) into inhaler and inhale daily. 2 puffs inhalation daily What changed: when to take this   torsemide 20 MG tablet Commonly known as: DEMADEX Take 20 mg by mouth daily.   Vitamin B 12 100 MCG Lozg Take 1 tablet by mouth daily.      Allergies  Allergen Reactions  . Vibramycin [Doxycycline Calcium]    Follow-up Information    Medicine, [facility]. Go on 11/10/2019.   Specialty: Family Medicine Why: Your follow up appointment is scheduled for 11/10/2019 at 1:30 PM.            The results of significant diagnostics from this hospitalization (including imaging, microbiology, ancillary and laboratory) are listed below for reference.    Significant Diagnostic Studies: CT CHEST WO CONTRAST  Result Date: 10/28/2019 CLINICAL DATA:  86 year old female with shortness of breath, progressive weakness. Negative for COVID-19 today. EXAM: CT CHEST WITHOUT CONTRAST  TECHNIQUE: Multidetector CT imaging of the chest was performed following the standard protocol without IV contrast. COMPARISON:  Portable chest earlier tonight. FINDINGS: Cardiovascular: Vascular patency is not evaluated in the absence of IV contrast. Calcified coronary artery atherosclerosis. Calcified aortic atherosclerosis. No cardiomegaly or pericardial effusion. Mediastinum/Nodes: Negative.  No lymphadenopathy. Lungs/Pleura: Trachea and carina are patent. The right mainstem is patent but there is subtotal opacification of the bronchus intermedius with fluid or secretions (series 4, image 73). Beyond the hila there are numerous areas of opacified small airways (series 4, images 80 and 87) with underlying bronchiectasis and peribronchial thickening. The findings are especially pronounced in the basal segments of the lower lobes and the right middle lobe. Superimposed widespread bilateral upper lobe uniform ground-glass opacity. Similar appearance in the superior segments of the lower lobes more so the right. No areas of consolidation.  No pleural effusion. Upper Abdomen: Negative noncontrast visible liver, spleen, pancreas, adrenal glands, kidneys and bowel. Musculoskeletal: Osteopenia. Mild to moderate T12 superior endplate compression with retropulsion of bone (sagittal image 73) resulting in mild spinal stenosis. The T12 posterior elements appear to remain intact. This is age indeterminate, but probably nonacute given superior endplate sclerosis. No other acute osseous abnormality. IMPRESSION: 1. Pulmonary hyperinflation and Bronchiectasis with widespread bronchial secretions and distal bronchial obstruction. Bilateral upper lung predominant homogeneous abnormal ground-glass opacity. Suspect acute on chronic viral/atypical respiratory infection. Consider MAI among other atypical agents. 2. No pleural effusion or mediastinal lymphadenopathy. 3. Calcified coronary artery atherosclerosis and Aortic  Atherosclerosis (ICD10-I70.0). 4. Osteopenia. Age indeterminate but probably chronic T12 compression fracture with retropulsion of bone resulting in mild spinal stenosis. Electronically Signed   By: Odessa Fleming M.D.   On: 10/28/2019 20:43   DG Chest Port 1 View  Result Date: 10/28/2019 CLINICAL DATA:  Shortness of breath EXAM: PORTABLE CHEST 1 VIEW COMPARISON:  01/15/2019 FINDINGS: Pulmonary hyperinflation. Tubular lucencies in the infrahilar lung suspicious for bronchiectasis. Nodules versus nodular foci of airspace disease at the bases. No pleural effusion or pneumothorax. Stable cardiomediastinal silhouette. Aortic atherosclerosis. IMPRESSION: 1. Hyperinflated lungs 2. Suspected bilateral lower lobe bronchiectasis. Nodular opacities at the bases, could be secondary to nodules, mucous plugging, or developing pneumonia. CT could help to clarify. Electronically Signed   By: Jasmine Pang M.D.   On: 10/28/2019 15:29    Microbiology: Recent Results (from the past 240 hour(s))  Respiratory Panel by RT PCR (  Flu A&B, Covid) - Nasopharyngeal Swab     Status: None   Collection Time: 10/28/19  3:15 PM   Specimen: Nasopharyngeal Swab  Result Value Ref Range Status   SARS Coronavirus 2 by RT PCR NEGATIVE NEGATIVE Final    Comment: (NOTE) SARS-CoV-2 target nucleic acids are NOT DETECTED. The SARS-CoV-2 RNA is generally detectable in upper respiratoy specimens during the acute phase of infection. The lowest concentration of SARS-CoV-2 viral copies this assay can detect is 131 copies/mL. A negative result does not preclude SARS-Cov-2 infection and should not be used as the sole basis for treatment or other patient management decisions. A negative result may occur with  improper specimen collection/handling, submission of specimen other than nasopharyngeal swab, presence of viral mutation(s) within the areas targeted by this assay, and inadequate number of viral copies (<131 copies/mL). A negative result must  be combined with clinical observations, patient history, and epidemiological information. The expected result is Negative. Fact Sheet for Patients:  PinkCheek.be Fact Sheet for Healthcare Providers:  GravelBags.it This test is not yet ap proved or cleared by the [place] FDA and  has been authorized for detection and/or diagnosis of SARS-CoV-2 by FDA under an Emergency Use Authorization (EUA). This EUA will remain  in effect (meaning this test can be used) for the duration of the COVID-19 declaration under Section 564(b)(1) of the Act, 21 U.S.C. section 360bbb-3(b)(1), unless the authorization is terminated or revoked sooner.    Influenza A by PCR NEGATIVE NEGATIVE Final   Influenza B by PCR NEGATIVE NEGATIVE Final    Comment: (NOTE) The Xpert Xpress SARS-CoV-2/FLU/RSV assay is intended as an aid in  the diagnosis of influenza from Nasopharyngeal swab specimens and  should not be used as a sole basis for treatment. Nasal washings and  aspirates are unacceptable for Xpert Xpress SARS-CoV-2/FLU/RSV  testing. Fact Sheet for Patients: PinkCheek.be Fact Sheet for Healthcare Providers: GravelBags.it This test is not yet approved or cleared by the [place] FDA and  has been authorized for detection and/or diagnosis of SARS-CoV-2 by  FDA under an Emergency Use Authorization (EUA). This EUA will remain  in effect (meaning this test can be used) for the duration of the  Covid-19 declaration under Section 564(b)(1) of the Act, 21  U.S.C. section 360bbb-3(b)(1), unless the authorization is  terminated or revoked. Performed at [facility], [address], [place], [place] [address]   Blood Culture (routine x 2)     Status: None (Preliminary result)   Collection Time: 10/28/19  4:02 PM   Specimen: Left Antecubital; Blood  Result Value Ref Range Status   Specimen  Description LEFT ANTECUBITAL  Final   Special Requests   Final    BOTTLES DRAWN AEROBIC AND ANAEROBIC Blood Culture adequate volume   Culture   Final    NO GROWTH 2 DAYS Performed at [facility], [address], [place], [place] [address]    Report Status PENDING  Incomplete  Blood Culture (routine x 2)     Status: None (Preliminary result)   Collection Time: 10/28/19  4:03 PM   Specimen: Right Antecubital; Blood  Result Value Ref Range Status   Specimen Description RIGHT ANTECUBITAL  Final   Special Requests   Final    BOTTLES DRAWN AEROBIC AND ANAEROBIC Blood Culture adequate volume   Culture   Final    NO GROWTH 2 DAYS Performed at [facility], [address], [place], [place] [address]    Report Status PENDING  Incomplete  SARS CORONAVIRUS 2 (TAT 6-24 HRS) Nasopharyngeal Nasopharyngeal Swab     Status: None   Collection Time: 10/28/19 10:13 PM   Specimen: Nasopharyngeal Swab  Result Value Ref Range Status   SARS Coronavirus 2 NEGATIVE NEGATIVE Final    Comment: (NOTE) SARS-CoV-2 target nucleic acids are NOT DETECTED. The SARS-CoV-2 RNA is generally detectable in upper and lower respiratory specimens during the acute phase of infection. Negative results do not preclude SARS-CoV-2 infection, do not rule out co-infections with other pathogens, and should not be used as the sole basis for treatment or other patient management decisions. Negative results must be combined with clinical observations, patient history, and epidemiological information. The expected result is Negative. Fact Sheet for Patients: HairSlick.no Fact Sheet for Healthcare Providers: quierodirigir.com This test is not yet approved or cleared by the [place] FDA and  has been authorized for detection and/or diagnosis of SARS-CoV-2 by FDA under an Emergency Use Authorization (EUA). This EUA will remain  in effect (meaning this test can be used) for  the duration of the COVID-19 declaration under Section 56 4(b)(1) of the Act, 21 U.S.C. section 360bbb-3(b)(1), unless the authorization is terminated or revoked sooner. Performed at [facility], [address], [place], [place] [address]      Labs: Basic Metabolic Panel: Recent Labs  Lab 10/28/19 1602 10/29/19 0344 10/30/19 0546  NA 138 138 137  K 4.5 5.4* 4.2  CL 91* 92* 92*  CO2 36* 35* 33*  GLUCOSE 94 135* 133*  BUN 43* 39* 40*  CREATININE 1.35* 1.51* 1.18*  CALCIUM 8.9 9.1 8.5*   Liver Function Tests: Recent Labs  Lab 10/28/19 1602  AST 35  ALT 28  ALKPHOS 85  BILITOT 0.5  PROT 7.5  ALBUMIN 3.3*   No results for input(s): LIPASE, AMYLASE in the last 168 hours. No results for input(s): AMMONIA in the last 168 hours. CBC: Recent Labs  Lab 10/28/19 1602 10/29/19 0344  WBC 11.4* 9.5  NEUTROABS 8.2*  --   HGB 10.3* 10.7*  HCT 33.1* 34.1*  MCV 97.4 96.6  PLT 350 350   Cardiac Enzymes: No results for input(s): CKTOTAL, CKMB, CKMBINDEX, TROPONINI in the last 168 hours. BNP: BNP (last 3 results) Recent Labs    10/28/19 1603  BNP 134.0*    ProBNP (last 3 results) No results for input(s): PROBNP in the last 8760 hours.  CBG: Recent Labs  Lab 10/29/19 2111 10/30/19 0812  GLUCAP 224* 130*       Signed:  Hollice Espy, MD [facility] 10/30/2019, 1:32 PM

## 2019-10-30 NOTE — TOC Transition Note (Signed)
Transition of Care [facility]) - CM/SW Discharge Note   Patient Details  Name: Brandy Carter MRN: [id number] Date of Birth: 11-10-1930  [facility] CM/SW Contact:  Bhakti Labella Sherryle Lis, LCSW Phone Number: 10/30/2019, 1:21 PM   Clinical Narrative:   Patient discharging home today with son while being followed by [facility] for home health/PT? CSW has scheduled PCP follow up for 11/10/2019 at 1:30PM.   Patient and family made aware of referral and discharge. Family and patient both agreeable.   Margaux Engen Sherryle Lis LCSWA Transitions of Care  Clinical Social Worker  Ph: [phone]    Final next level of care: Home w Home Health Services Barriers to Discharge: No Barriers Identified   Patient Goals and CMS Choice Patient states their goals for this hospitalization and ongoing recovery are:: to return home with son, being followed by home health      Discharge Placement                Patient to be transferred to facility by: Family Name of family member notified: Yulanda Diggs PH:[phone] Patient and family notified of of transfer: 10/30/19  Discharge Plan and Services In-house Referral: Clinical Social Work   Post Acute Care Choice: Home Health                    HH Arranged: PT HH Agency: [facility] ([facility]) Date HH Agency Contacted: 10/30/19 Time HH Agency Contacted: 1130 Representative spoke with at [facility] Agency: Marisa Severin PH: [phone]  Social Determinants of Health (SDOH) Interventions     Readmission Risk Interventions Readmission Risk Prevention Plan 10/30/2019  Transportation Screening Complete  PCP or Specialist Appt within 3-5 Days Complete  HRI or Home Care Consult Complete  Social Work Consult for Recovery Care Planning/Counseling Complete  Palliative Care Screening Not Applicable  Medication Review [employer]) Complete

## 2019-10-30 NOTE — Discharge Instructions (Signed)
Protein-Energy Malnutrition Protein-energy malnutrition is when a person does not eat enough protein, fat, and calories. When this happens over time, it can lead to severe loss of muscle tissue (muscle wasting). This condition also affects the body's defense system (immune system) and can lead to other health problems. What are the causes? This condition may be caused by:  Not eating enough protein, fat, or calories.  Having certain chronic medical conditions.  Eating too little. What increases the risk? The following factors may make you more likely to develop this condition:  Living in poverty.  Long-term hospitalization.  Alcohol or drug dependency. Addiction often leads to a lifestyle in which proper diet is ignored. Dependency can also hurt the metabolism and the body's ability to absorb nutrients.  Eating disorders, such as anorexia nervosa or bulimia.  Chewing or swallowing problems. People with these disorders may not eat enough.  Having certain conditions, such as: ? Inflammatory bowel disease. Inflammation of the intestines makes it difficult for the body to absorb nutrients. ? Cancer or AIDS. These diseases can cause a loss of appetite. ? Chronic heart failure. This interferes with how the body uses nutrients. ? Cystic fibrosis. This disease can make it difficult for the body to absorb nutrients.  Eating a diet that extremely restricts protein, fat, or calorie intake. What are the signs or symptoms? Symptoms of this condition include:  Fatigue.  Weakness.  Dizziness.  Fainting.  Weight loss.  Loss of muscle tone and muscle mass.  Poor immune response.  Lack of menstruation.  Poor memory.  Hair loss.  Skin changes. How is this diagnosed? This condition may be diagnosed based on:  Your medical and dietary history.  A physical exam. This may include a measurement of your body mass index (BMI).  Blood tests. How is this treated? This condition may  be managed with:  Nutrition therapy. This may include working with a diet and nutrition specialist (dietitian).  Treatment for underlying conditions. People with severe protein-energy malnutrition may need to be treated in a hospital. This may involve receiving nutrition and fluids through an IV. Follow these instructions at home:   Eat a balanced diet. In each meal, include at least one food that is high in protein. Foods that are high in protein include: ? Meat. ? Poultry. ? Fish. ? Eggs. ? Cheese. ? Milk. ? Beans. ? Nuts.  Eat nutrient-rich foods that are easy to swallow and digest, such as: ? Fruit and yogurt smoothies. ? Oatmeal with nut butter.  Try to eat six small meals each day instead of three large meals.  Take vitamin and protein supplements as told by your health care provider or dietitian.  Follow your health care provider's recommendations about exercise and activity.  Keep all follow-up visits as told by your health care provider. This is important. Contact a health care provider if you:  Have increased weakness or fatigue.  Faint.  Are a woman and you stop having your period (menstruating).  Have rapid hair loss.  Have unexpected weight loss.  Have diarrhea.  Have nausea and vomiting. Get help right away if you have:  Difficulty breathing.  Chest pain. Summary  Protein-energy malnutrition is when a person does not eat enough protein, fat, and calories.  Protein-energy malnutrition can lead to severe loss of muscle tissue (muscle wasting). This condition also affects the body's defense system (immune system) and can lead to other health problems.  Talk with your health care provider about treatment for this   condition. Effective treatment depends on the underlying cause of the malnutrition. This information is not intended to replace advice given to you by your health care provider. Make sure you discuss any questions you have with your health  care provider. Document Revised: 10/16/2017 Document Reviewed: 10/16/2017 Elsevier Patient Education  2020 [employer]  

## 2019-10-30 NOTE — TOC Initial Note (Signed)
Transition of Care [facility]) - Initial/Assessment Note    Patient Details  Name: Brandy Carter MRN: [id number] Date of Birth: 05-Apr-1931  Transition of Care [facility]) CM/SW Contact:    [place], LCSW Phone Number: 10/30/2019, 1:17 PM  Clinical Narrative:    Patient admitted on 10/29/2019 for COPD exacerbation. CSW in contact with patient and patients son Hassell Done to conduct TOC assessment. Patient reports that she lives at home with her son. Patient has a cane and walker for DME use. Patient reports that she has had home health care in the past. CSW informs patient of PT recommendation to discharge home with Home health PT. Patient is agreeable.   CSW in contact with Tim Justis at [place] [facility]. Octavia Bruckner has accepted patient referral for home health.   TOC team will continue to follow patient for discharge related needs.  [place] LCSWA Transitions of Care  Clinical Social Worker  Ph: [phone]               Expected Discharge Plan: Home w Home Health Services Barriers to Discharge: No Barriers Identified   Patient Goals and CMS Choice Patient states their goals for this hospitalization and ongoing recovery are:: patient wishes to return home with son      Expected Discharge Plan and Services Expected Discharge Plan: [place] In-house Referral: Clinical Social Work   Post Acute Care Choice: [place] arrangements for the past 2 months: [place]: PT [place] [facility]: [facility] [employer] [employer] Date [place]: 10/30/19 Time [place]: 65 Representative spoke with at [place]: TIm Justis [place]: [phone]  Prior Living Arrangements/Services Living arrangements for the past 2 months: [place] with:: Adult Children Patient language and need for interpreter reviewed:: Yes Do you feel safe going back to the place where  you live?: Yes      Need for Family Participation in Patient Care: Yes (Comment) Care giver support system in place?: Yes (comment) Current home services: DME Criminal Activity/Legal Involvement Pertinent to Current Situation/Hospitalization: No - Comment as needed  Activities of Daily Living Home Assistive Devices/Equipment: Cane (specify quad or straight), Oxygen, Nebulizer, Walker (specify type) ADL Screening (condition at time of admission) Patient's cognitive ability adequate to safely complete daily activities?: Yes Is the patient deaf or have difficulty hearing?: No Does the patient have difficulty seeing, even when wearing glasses/contacts?: No Does the patient have difficulty concentrating, remembering, or making decisions?: No Patient able to express need for assistance with ADLs?: Yes Does the patient have difficulty dressing or bathing?: No Independently performs ADLs?: Yes (appropriate for developmental age) Does the patient have difficulty walking or climbing stairs?: Yes Weakness of Legs: Both Weakness of Arms/Hands: None  Permission Sought/Granted Permission sought to share information with : Case Manager, Family Supports Permission granted to share information with : Yes, Release of Information Signed  Share Information with NAME: Maddy Graham  Permission granted to share info w AGENCY: [facility] [employer] granted to share info w Relationship: Son  Permission granted to share info w Contact Information: PH: [phone]  Emotional Assessment Appearance:: Appears stated age Attitude/Demeanor/Rapport: Gracious Affect (typically observed): Stable Orientation: : Oriented to Self, Oriented to Place, Oriented to  Time, Oriented to Situation Alcohol /  Substance Use: Not Applicable Psych Involvement: No (comment)  Admission diagnosis:  SOB (shortness of breath) [R06.02] COPD exacerbation (HCC) [J44.1] Acute respiratory failure with hypoxia (HCC) [J96.01] Viral  pneumonia [J12.9] Community acquired pneumonia, unspecified laterality [J18.9] Patient Active Problem List   Diagnosis Date Noted  . Viral pneumonia 10/29/2019  . COPD exacerbation (HCC) 10/28/2019  . Heme positive stool 01/27/2019  . Normocytic anemia 01/27/2019  . GI bleed 01/12/2019  . Bilateral leg edema 01/08/2019  . Hyponatremia 01/06/2019  . Atrial fibrillation with RVR (HCC) 01/02/2019  . Post herpetic neuralgia 01/02/2019  . Bronchiectasis without complication (HCC) 01/02/2019  . COPD (chronic obstructive pulmonary disease) with emphysema (HCC) 01/02/2019  . Chronic respiratory failure with hypoxia (HCC) 01/02/2019  . Essential hypertension 01/02/2019  . Chronic anxiety 01/02/2019  . Severe protein-calorie malnutrition (HCC) 01/02/2019  . Failure to thrive in adult 01/02/2019  . Anemia of chronic disease 01/02/2019  . Bilateral pleural effusion 01/02/2019  . Community acquired pneumonia of right lung 01/02/2019  . Status post chest tube placement (HCC) 01/02/2019   PCP:  Medicine, [facility]:   [facility] [address], [place] - [address] [place] [address] [place] [address] [place] [address] Phone: [phone] Fax: [phone]  [facility] [place], [place] - [address] [place] [address] Phone: [phone] Fax: [phone]     Social Determinants of Health (SDOH) Interventions    Readmission Risk Interventions Readmission Risk Prevention Plan 10/30/2019  Transportation Screening Complete  PCP or Specialist Appt within 3-5 Days Complete  HRI or Home Care Consult Complete  Social Work Consult for Recovery Care Planning/Counseling Complete  Palliative Care Screening Not Applicable  Medication Review [employer]) Complete

## 2019-11-02 LAB — CULTURE, BLOOD (ROUTINE X 2)
Culture: NO GROWTH
Culture: NO GROWTH
Special Requests: ADEQUATE
Special Requests: ADEQUATE

## 2019-12-25 ENCOUNTER — Other Ambulatory Visit: Payer: Self-pay

## 2019-12-25 ENCOUNTER — Ambulatory Visit (INDEPENDENT_AMBULATORY_CARE_PROVIDER_SITE_OTHER): Payer: Medicare HMO

## 2019-12-25 ENCOUNTER — Ambulatory Visit
Admission: EM | Admit: 2019-12-25 | Discharge: 2019-12-25 | Disposition: A | Discharge status: Home / Self Care | Payer: Medicare HMO | Attending: Emergency Medicine | Admitting: Emergency Medicine

## 2019-12-25 DIAGNOSIS — R05 Cough: Secondary | ICD-10-CM

## 2019-12-25 DIAGNOSIS — J449 Chronic obstructive pulmonary disease, unspecified: Secondary | ICD-10-CM | POA: Diagnosis not present

## 2019-12-25 DIAGNOSIS — Z87891 Personal history of nicotine dependence: Secondary | ICD-10-CM | POA: Diagnosis not present

## 2019-12-25 DIAGNOSIS — M549 Dorsalgia, unspecified: Secondary | ICD-10-CM | POA: Diagnosis not present

## 2019-12-25 DIAGNOSIS — M546 Pain in thoracic spine: Secondary | ICD-10-CM | POA: Diagnosis not present

## 2019-12-25 NOTE — Discharge Instructions (Addendum)
Advised patient to follow-up with primary care Continue to take Tylenol as prescribed for back pain Return or go to ED for worsening of symptoms

## 2019-12-25 NOTE — ED Provider Notes (Signed)
[facility]    CSN: [id number] Arrival date & time: 12/25/19  1057      History   Chief Complaint Chief Complaint  Patient presents with  . Back Pain    HPI Brandy Carter is a 86 y.o. female.   Who presented to the urgent care with a complaint of bilateral back pain, increased shortness of breath for the past week. Patient states symptoms are similar to when she had  shingles.. She localizes the pain to her back.  She describes the pain as constant and achy.  He has tried OTC medications with moderate relief.  She denies similar symptoms in the past.     The history is provided by the patient. No language interpreter was used.  Back Pain   Past Medical History:  Diagnosis Date  . Allergy   . Anxiety   . Asthma   . Atrial fibrillation with RVR (HCC)   . Bronchiectasis (HCC)   . COPD (chronic obstructive pulmonary disease) (HCC)   . Hypertension   . On home oxygen therapy   . Parapneumonic effusion    s/p chest tube 12/2018  . Severe protein-calorie malnutrition (HCC)   . Shingles    Jan 2020; on back   . SVT (supraventricular tachycardia) [facility])     Patient Active Problem List   Diagnosis Date Noted  . Acute on chronic respiratory failure with hypoxia (HCC) 10/30/2019  . Viral pneumonia 10/29/2019  . COPD exacerbation (HCC) 10/28/2019  . Heme positive stool 01/27/2019  . Normocytic anemia 01/27/2019  . GI bleed 01/12/2019  . Bilateral leg edema 01/08/2019  . Hyponatremia 01/06/2019  . Atrial fibrillation with RVR (HCC) 01/02/2019  . Post herpetic neuralgia 01/02/2019  . Bronchiectasis without complication (HCC) 01/02/2019  . COPD (chronic obstructive pulmonary disease) with emphysema (HCC) 01/02/2019  . Chronic respiratory failure with hypoxia (HCC) 01/02/2019  . Essential hypertension 01/02/2019  . Chronic anxiety 01/02/2019  . Severe protein-calorie malnutrition (HCC) 01/02/2019  . Failure to thrive in adult 01/02/2019  . Anemia of chronic  disease 01/02/2019  . Bilateral pleural effusion 01/02/2019  . Community acquired pneumonia of right lung 01/02/2019  . Status post chest tube placement [facility]) 01/02/2019    Past Surgical History:  Procedure Laterality Date  . APPENDECTOMY    . BREAST SURGERY    . CATARACT EXTRACTION    . PILONIDAL CYST / SINUS EXCISION      OB History    Gravida      Para      Term      Preterm      AB      Living  1     SAB      TAB      Ectopic      Multiple      Live Births               Home Medications    Prior to Admission medications   Medication Sig Start Date End Date Taking? Authorizing Provider  acetaminophen (TYLENOL) 500 MG tablet Take 500 mg by mouth every 6 (six) hours as needed for mild pain.     [provider]  albuterol (PROVENTIL) (2.5 MG/3ML) 0.083% nebulizer solution Take 2.5 mg by nebulization every 4 (four) hours as needed for wheezing or shortness of breath.    [provider]  albuterol (VENTOLIN HFA) 108 (90 Base) MCG/ACT inhaler Inhale into the lungs. 02/19/19   [provider]  ALPRAZolam Prudy Feeler)  0.25 MG tablet Take 0.25 mg by mouth at bedtime.  10/02/19   [provider]  amiodarone (PACERONE) 200 MG tablet Take 200 mg by mouth daily.  01/07/19   [provider]  cholecalciferol (VITAMIN D3) 25 MCG (1000 UT) tablet Take 1,000 Units by mouth daily.    [provider]  Cyanocobalamin (VITAMIN B 12) 100 MCG LOZG Take 1 tablet by mouth daily.    [provider]  feeding supplement, ENSURE ENLIVE, (ENSURE ENLIVE) LIQD Take 237 mLs by mouth 2 (two) times daily between meals. 10/30/19   Hollice Espy, MD  gabapentin (NEURONTIN) 100 MG capsule Take 100 mg by mouth 2 (two) times daily.    [provider]  levothyroxine (SYNTHROID) 25 MCG tablet Take 25 mcg by mouth daily. 08/20/19   [provider]  metoprolol tartrate (LOPRESSOR) 25 MG tablet Take 12.5 mg by mouth 2 (two)  times daily.    [provider]  OXYGEN Inhale 2 L/min into the lungs continuous. 01/02/19   [provider]  pantoprazole (PROTONIX) 40 MG tablet Take 40 mg by mouth daily. 01/07/19   [provider]  potassium chloride SA (K-DUR,KLOR-CON) 20 MEQ tablet Take 20 mEq by mouth daily. 01/07/19   [provider]  tiotropium (SPIRIVA) 18 MCG inhalation capsule Place 1 capsule (18 mcg total) into inhaler and inhale daily. 2 puffs inhalation daily 10/30/19   Hollice Espy, MD  torsemide (DEMADEX) 20 MG tablet Take 20 mg by mouth daily. 01/13/19   [provider]    Family History Family History  Problem Relation Age of Onset  . Hypertension Sister   . Heart disease Brother     Social History Social History   Tobacco Use  . Smoking status: Never Smoker  . Smokeless tobacco: Never Used  Substance Use Topics  . Alcohol use: Never  . Drug use: Never     Allergies   Vibramycin [doxycycline calcium]   Review of Systems Review of Systems  Constitutional: Negative.   Respiratory: Positive for shortness of breath.   Cardiovascular: Negative.   Musculoskeletal: Positive for back pain.  All other systems reviewed and are negative.    Physical Exam Triage Vital Signs ED Triage Vitals [12/25/19 1107]  Enc Vitals Group     BP      Pulse      Resp      Temp      Temp src      SpO2      Weight      Height      Head Circumference      Peak Flow      Pain Score 8     Pain Loc      Pain Edu?      Excl. in GC?    No data found.  Updated Vital Signs BP 139/76   Pulse 69   Temp (!) 97.4 F (36.3 C)   Resp (!) 24   SpO2 90%   Visual Acuity Right Eye Distance:   Left Eye Distance:   Bilateral Distance:    Right Eye Near:   Left Eye Near:    Bilateral Near:     Physical Exam Vitals and nursing note reviewed.  Constitutional:      General: She is not in acute distress.    Appearance: Normal appearance. She is normal  weight. She is not ill-appearing or toxic-appearing.  Cardiovascular:     Rate and Rhythm: Normal rate  and regular rhythm.     Pulses: Normal pulses.     Heart sounds: Normal heart sounds. No murmur. No gallop.   Pulmonary:     Effort: Pulmonary effort is normal. No respiratory distress.     Breath sounds: Normal breath sounds. No stridor. No wheezing, rhonchi or rales.  Chest:     Chest wall: No tenderness.  Musculoskeletal:        General: Tenderness present.     Cervical back: No tenderness.     Thoracic back: Tenderness present.  Neurological:     General: No focal deficit present.     Mental Status: She is alert and oriented to person, place, and time.     Cranial Nerves: No cranial nerve deficit.     Sensory: No sensory deficit.     Motor: No weakness.     Coordination: Coordination normal.      UC Treatments / Results  Labs (all labs ordered are listed, but only abnormal results are displayed) Labs Reviewed - No data to display  EKG   Radiology DG Chest 2 View  Result Date: 12/25/2019 CLINICAL DATA:  Back pain for 1 week, cough, history of smoking, COPD EXAM: CHEST - 2 VIEW COMPARISON:  Chest radiographs, CT chest 10/28/2019 FINDINGS: The heart size and mediastinal contours are within normal limits. No significant change in extensive nodular and interstitial airspace opacity throughout the lungs. The visualized skeletal structures are unremarkable. IMPRESSION: No significant change in extensive nodular and interstitial airspace opacity throughout the lungs, better assessed by prior CT and most in keeping with atypical infection, including atypical mycobacterium. No new or focal airspace opacity. Electronically Signed   By: Eddie Candle M.D.   On: 12/25/2019 11:40    Procedures Procedures (including critical care time)  Medications Ordered in UC Medications - No data to display  Initial Impression / Assessment and Plan / UC Course  I have reviewed the triage vital  signs and the nursing notes.  Pertinent labs & imaging results that were available during my care of the patient were reviewed by me and considered in my medical decision making (see chart for details).    Patient is stable at discharge.  X-ray is negative for significant changes throughout the lungs.  Further evaluation with a CT was recommended.  I have reviewed the x-ray myself and the radiologist interpretation.  I am in agreement with the radiologist interpretation.  Advised patient to follow-up with primary care provider Return or go to ED for worsening symptoms  Final Clinical Impressions(s) / UC Diagnoses   Final diagnoses:  Acute bilateral thoracic back pain     Discharge Instructions     Advised patient to follow-up with primary care Continue to take Tylenol as prescribed for back pain Return or go to ED for worsening of symptoms    ED Prescriptions    None     PDMP not reviewed this encounter.   Emerson Monte, FNP 12/25/19 1206

## 2019-12-25 NOTE — ED Triage Notes (Signed)
C/ G reports patient has had increased SOB

## 2019-12-25 NOTE — ED Triage Notes (Signed)
Pt presents with bilateral flank pain that began about a week ago. Pt states she had shingles last year and felt similar. Pt doesn't have current rash

## 2020-03-16 ENCOUNTER — Emergency Department (HOSPITAL_COMMUNITY): Payer: Medicare HMO

## 2020-03-16 ENCOUNTER — Encounter (HOSPITAL_COMMUNITY): Payer: Self-pay

## 2020-03-16 ENCOUNTER — Inpatient Hospital Stay (HOSPITAL_COMMUNITY)
Admission: EM | Admit: 2020-03-16 | Discharge: 2020-03-18 | DRG: 190 | Disposition: A | Discharge status: Home Health | Payer: Medicare HMO | Attending: Family Medicine | Admitting: Family Medicine

## 2020-03-16 ENCOUNTER — Other Ambulatory Visit: Payer: Self-pay

## 2020-03-16 DIAGNOSIS — N179 Acute kidney failure, unspecified: Secondary | ICD-10-CM

## 2020-03-16 DIAGNOSIS — I129 Hypertensive chronic kidney disease with stage 1 through stage 4 chronic kidney disease, or unspecified chronic kidney disease: Secondary | ICD-10-CM | POA: Diagnosis present

## 2020-03-16 DIAGNOSIS — E039 Hypothyroidism, unspecified: Secondary | ICD-10-CM | POA: Diagnosis present

## 2020-03-16 DIAGNOSIS — Z681 Body mass index (BMI) 19 or less, adult: Secondary | ICD-10-CM

## 2020-03-16 DIAGNOSIS — J9621 Acute and chronic respiratory failure with hypoxia: Secondary | ICD-10-CM | POA: Diagnosis present

## 2020-03-16 DIAGNOSIS — J479 Bronchiectasis, uncomplicated: Secondary | ICD-10-CM | POA: Diagnosis present

## 2020-03-16 DIAGNOSIS — Z79899 Other long term (current) drug therapy: Secondary | ICD-10-CM

## 2020-03-16 DIAGNOSIS — E440 Moderate protein-calorie malnutrition: Secondary | ICD-10-CM | POA: Diagnosis present

## 2020-03-16 DIAGNOSIS — Z20822 Contact with and (suspected) exposure to covid-19: Secondary | ICD-10-CM | POA: Diagnosis present

## 2020-03-16 DIAGNOSIS — Z9981 Dependence on supplemental oxygen: Secondary | ICD-10-CM

## 2020-03-16 DIAGNOSIS — Z79890 Hormone replacement therapy (postmenopausal): Secondary | ICD-10-CM

## 2020-03-16 DIAGNOSIS — J441 Chronic obstructive pulmonary disease with (acute) exacerbation: Secondary | ICD-10-CM | POA: Diagnosis present

## 2020-03-16 DIAGNOSIS — I10 Essential (primary) hypertension: Secondary | ICD-10-CM | POA: Diagnosis present

## 2020-03-16 DIAGNOSIS — I4891 Unspecified atrial fibrillation: Secondary | ICD-10-CM | POA: Diagnosis present

## 2020-03-16 DIAGNOSIS — J439 Emphysema, unspecified: Principal | ICD-10-CM | POA: Diagnosis present

## 2020-03-16 DIAGNOSIS — N1832 Chronic kidney disease, stage 3b: Secondary | ICD-10-CM | POA: Diagnosis present

## 2020-03-16 DIAGNOSIS — Z8249 Family history of ischemic heart disease and other diseases of the circulatory system: Secondary | ICD-10-CM

## 2020-03-16 DIAGNOSIS — I480 Paroxysmal atrial fibrillation: Secondary | ICD-10-CM | POA: Diagnosis present

## 2020-03-16 LAB — CBC WITH DIFFERENTIAL/PLATELET
Abs Immature Granulocytes: 0.23 10*3/uL — ABNORMAL HIGH (ref 0.00–0.07)
Basophils Absolute: 0.1 10*3/uL (ref 0.0–0.1)
Basophils Relative: 1 %
Eosinophils Absolute: 0.2 10*3/uL (ref 0.0–0.5)
Eosinophils Relative: 1 %
HCT: 38.3 % (ref 36.0–46.0)
Hemoglobin: 11.6 g/dL — ABNORMAL LOW (ref 12.0–15.0)
Immature Granulocytes: 2 %
Lymphocytes Relative: 17 %
Lymphs Abs: 1.9 10*3/uL (ref 0.7–4.0)
MCH: 29.8 pg (ref 26.0–34.0)
MCHC: 30.3 g/dL (ref 30.0–36.0)
MCV: 98.5 fL (ref 80.0–100.0)
Monocytes Absolute: 0.9 10*3/uL (ref 0.1–1.0)
Monocytes Relative: 8 %
Neutro Abs: 7.9 10*3/uL — ABNORMAL HIGH (ref 1.7–7.7)
Neutrophils Relative %: 71 %
Platelets: 361 10*3/uL (ref 150–400)
RBC: 3.89 MIL/uL (ref 3.87–5.11)
RDW: 15.4 % (ref 11.5–15.5)
WBC: 11.2 10*3/uL — ABNORMAL HIGH (ref 4.0–10.5)
nRBC: 0 % (ref 0.0–0.2)

## 2020-03-16 LAB — BRAIN NATRIURETIC PEPTIDE: B Natriuretic Peptide: 138 pg/mL — ABNORMAL HIGH (ref 0.0–100.0)

## 2020-03-16 LAB — TROPONIN I (HIGH SENSITIVITY)
Troponin I (High Sensitivity): 11 ng/L (ref <18)
Troponin I (High Sensitivity): 12 ng/L (ref <18)

## 2020-03-16 LAB — BASIC METABOLIC PANEL
Anion gap: 12 (ref 5–15)
BUN: 49 mg/dL — ABNORMAL HIGH (ref 8–23)
CO2: 39 mmol/L — ABNORMAL HIGH (ref 22–32)
Calcium: 9.1 mg/dL (ref 8.9–10.3)
Chloride: 87 mmol/L — ABNORMAL LOW (ref 98–111)
Creatinine, Ser: 1.56 mg/dL — ABNORMAL HIGH (ref 0.44–1.00)
GFR calc Af Amer: 34 mL/min — ABNORMAL LOW (ref >60)
GFR calc non Af Amer: 30 mL/min — ABNORMAL LOW (ref >60)
Glucose, Bld: 93 mg/dL (ref 70–99)
Potassium: 4.6 mmol/L (ref 3.5–5.1)
Sodium: 138 mmol/L (ref 135–145)

## 2020-03-16 LAB — SARS CORONAVIRUS 2 BY RT PCR (HOSPITAL ORDER, PERFORMED IN ~~LOC~~ HOSPITAL LAB): SARS Coronavirus 2: NEGATIVE

## 2020-03-16 MED ORDER — PANTOPRAZOLE SODIUM 40 MG PO TBEC
40.0000 mg | DELAYED_RELEASE_TABLET | Freq: Every day | ORAL | Status: DC
  Administered 2020-03-17 – 2020-03-18 (×2): 40 mg via ORAL
  Filled 2020-03-16 (×2): qty 1

## 2020-03-16 MED ORDER — METHYLPREDNISOLONE SODIUM SUCC 125 MG IJ SOLR
60.0000 mg | Freq: Once | INTRAMUSCULAR | Status: AC
  Administered 2020-03-16: 60 mg via INTRAVENOUS
  Filled 2020-03-16: qty 2

## 2020-03-16 MED ORDER — HEPARIN SODIUM (PORCINE) 5000 UNIT/ML IJ SOLN
5000.0000 [IU] | Freq: Three times a day (TID) | INTRAMUSCULAR | Status: DC
  Administered 2020-03-16 – 2020-03-18 (×5): 5000 [IU] via SUBCUTANEOUS
  Filled 2020-03-16 (×6): qty 1

## 2020-03-16 MED ORDER — IPRATROPIUM-ALBUTEROL 0.5-2.5 (3) MG/3ML IN SOLN
3.0000 mL | Freq: Once | RESPIRATORY_TRACT | Status: AC
  Administered 2020-03-16: 3 mL via RESPIRATORY_TRACT
  Filled 2020-03-16: qty 3

## 2020-03-16 MED ORDER — ACETAMINOPHEN 325 MG PO TABS
650.0000 mg | ORAL_TABLET | Freq: Four times a day (QID) | ORAL | Status: DC | PRN
  Administered 2020-03-16 – 2020-03-18 (×3): 650 mg via ORAL
  Filled 2020-03-16 (×4): qty 2

## 2020-03-16 MED ORDER — IPRATROPIUM BROMIDE HFA 17 MCG/ACT IN AERS: 2.0000 | INHALATION_SPRAY | Freq: Once | RESPIRATORY_TRACT | Status: DC

## 2020-03-16 MED ORDER — ONDANSETRON HCL 4 MG PO TABS: 4.0000 mg | ORAL_TABLET | Freq: Four times a day (QID) | ORAL | Status: DC | PRN

## 2020-03-16 MED ORDER — ACETAMINOPHEN 650 MG RE SUPP: 650.0000 mg | Freq: Four times a day (QID) | RECTAL | Status: DC | PRN

## 2020-03-16 MED ORDER — LEVOTHYROXINE SODIUM 50 MCG PO TABS
50.0000 ug | ORAL_TABLET | Freq: Every day | ORAL | Status: DC
  Administered 2020-03-17 – 2020-03-18 (×2): 50 ug via ORAL
  Filled 2020-03-16 (×2): qty 1

## 2020-03-16 MED ORDER — IPRATROPIUM-ALBUTEROL 0.5-2.5 (3) MG/3ML IN SOLN
3.0000 mL | Freq: Four times a day (QID) | RESPIRATORY_TRACT | Status: AC
  Administered 2020-03-17 (×3): 3 mL via RESPIRATORY_TRACT
  Filled 2020-03-16 (×3): qty 3

## 2020-03-16 MED ORDER — IPRATROPIUM-ALBUTEROL 0.5-2.5 (3) MG/3ML IN SOLN: 3.0000 mL | Freq: Four times a day (QID) | RESPIRATORY_TRACT | Status: DC

## 2020-03-16 MED ORDER — ALBUTEROL SULFATE HFA 108 (90 BASE) MCG/ACT IN AERS
2.0000 | INHALATION_SPRAY | Freq: Once | RESPIRATORY_TRACT | Status: AC
  Administered 2020-03-16: 2 via RESPIRATORY_TRACT
  Filled 2020-03-16: qty 6.7

## 2020-03-16 MED ORDER — METHYLPREDNISOLONE SODIUM SUCC 40 MG IJ SOLR
40.0000 mg | Freq: Two times a day (BID) | INTRAMUSCULAR | Status: DC
  Administered 2020-03-17: 40 mg via INTRAVENOUS
  Filled 2020-03-16: qty 1

## 2020-03-16 MED ORDER — ONDANSETRON HCL 4 MG/2ML IJ SOLN: 4.0000 mg | Freq: Four times a day (QID) | INTRAMUSCULAR | Status: DC | PRN

## 2020-03-16 MED ORDER — METOPROLOL TARTRATE 25 MG PO TABS
12.5000 mg | ORAL_TABLET | Freq: Two times a day (BID) | ORAL | Status: DC
  Administered 2020-03-16 – 2020-03-18 (×4): 12.5 mg via ORAL
  Filled 2020-03-16 (×4): qty 1

## 2020-03-16 MED ORDER — ENSURE ENLIVE PO LIQD
1.0000 | Freq: Two times a day (BID) | ORAL | Status: DC
  Administered 2020-03-17 – 2020-03-18 (×3): 237 mL via ORAL

## 2020-03-16 MED ORDER — SODIUM CHLORIDE 0.9 % IV BOLUS
1000.0000 mL | Freq: Once | INTRAVENOUS | Status: AC
  Administered 2020-03-16: 1000 mL via INTRAVENOUS

## 2020-03-16 MED ORDER — IPRATROPIUM-ALBUTEROL 0.5-2.5 (3) MG/3ML IN SOLN: 3.0000 mL | RESPIRATORY_TRACT | Status: DC | PRN

## 2020-03-16 MED ORDER — POLYETHYLENE GLYCOL 3350 17 G PO PACK: 17.0000 g | PACK | Freq: Every day | ORAL | Status: DC | PRN

## 2020-03-16 MED ORDER — SODIUM CHLORIDE 0.9 % IV SOLN
500.0000 mg | Freq: Every day | INTRAVENOUS | Status: DC
  Administered 2020-03-16 – 2020-03-17 (×2): 500 mg via INTRAVENOUS
  Filled 2020-03-16 (×2): qty 500

## 2020-03-16 MED ORDER — ALPRAZOLAM 0.25 MG PO TABS
0.1250 mg | ORAL_TABLET | Freq: Every day | ORAL | Status: DC
  Administered 2020-03-16 – 2020-03-17 (×2): 0.125 mg via ORAL
  Filled 2020-03-16 (×2): qty 1

## 2020-03-16 MED ORDER — GUAIFENESIN-DM 100-10 MG/5ML PO SYRP
10.0000 mL | ORAL_SOLUTION | Freq: Three times a day (TID) | ORAL | Status: AC
  Administered 2020-03-16 – 2020-03-17 (×4): 10 mL via ORAL
  Filled 2020-03-16 (×4): qty 10

## 2020-03-16 NOTE — H&P (Addendum)
History and Physical    Brandy Carter WIO:[id number] DOB: Jul 21, 1931 DOA: 03/16/2020  PCP: Medicine, [facility]   Patient coming from: home  I have personally briefly reviewed patient's old medical records in [place]  Chief Complaint: Difficulty breathing.   HPI: Brandy Carter is a 86 y.o. female with medical history significant for  COPD with chronic respiratory failure, bronchiectasis, hypertension atrial fibrillation. Patient presented to the ED with complaints of difficulty breathing over the past 4 to 5 days.  Also with productive cough, and generalized weakness.  At baseline she is on 2 L of oxygen but she has had to increase this to 3 L as she is still having difficulty breathing. No chest pain, no lower extremity swelling.  She reports compliance with her medications including torsemide.  No vomiting, no loose stools.  ED Course: In the ED O2 sats were 83% on 3 L, improved with 4 L O2.  WBC 11.2.  BNP 138.  Troponin flat is 12 X 2, mild creatinine elevation 1.56 from baseline about 1.1.  Portable chest x-ray shows chronic lung changes with emphysema, bronchiectasis and chronic bronchitic type interstitial lung changes. 60 mg IV Solu-Medrol given, DuoNebs, hospitalist to admit for COPD exacerbation with acute on chronic respiratory failure.  Review of Systems: As per HPI all other systems reviewed and negative.  Past Medical History:  Diagnosis Date  . Allergy   . Anxiety   . Asthma   . Atrial fibrillation with RVR ([place])   . Bronchiectasis ([place])   . COPD (chronic obstructive pulmonary disease) ([place])   . Hypertension   . On home oxygen therapy   . Parapneumonic effusion    s/p chest tube 12/2018  . Severe protein-calorie malnutrition ([place])   . Shingles    Jan 2020; on back   . SVT (supraventricular tachycardia) (HCC)     Past Surgical History:  Procedure Laterality Date  . APPENDECTOMY    . BREAST SURGERY    . CATARACT EXTRACTION    .  PILONIDAL CYST / SINUS EXCISION       reports that she has never smoked. She has never used smokeless tobacco. She reports that she does not drink alcohol or use drugs.  Allergies  Allergen Reactions  . Vibramycin [Doxycycline Calcium] Hives    Facial redness    Family History  Problem Relation Age of Onset  . Hypertension Sister   . Heart disease Brother     Prior to Admission medications   Medication Sig Start Date End Date Taking? Authorizing Provider  acetaminophen (TYLENOL) 500 MG tablet Take 500 mg by mouth every 6 (six) hours as needed for mild pain.    Yes [provider]  ALPRAZolam (XANAX) 0.25 MG tablet Take 0.125 mg by mouth at bedtime.  10/02/19  Yes [provider]  cholecalciferol (VITAMIN D3) 25 MCG (1000 UT) tablet Take 1,000 Units by mouth daily.   Yes [provider]  Cyanocobalamin (VITAMIN B 12) 100 MCG LOZG Take 1 tablet by mouth daily.   Yes [provider]  feeding supplement, ENSURE ENLIVE, (ENSURE ENLIVE) LIQD Take 237 mLs by mouth 2 (two) times daily between meals. 10/30/19  Yes Annita Brod, MD  ipratropium-albuterol (DUONEB) 0.5-2.5 (3) MG/3ML SOLN Inhale 3 mLs into the lungs every 6 (six) hours as needed (FOR SHORTNESS OF BREATH/WHEEZING).  02/17/20  Yes [provider]  levothyroxine (SYNTHROID) 50 MCG tablet Take 50 mcg by mouth daily before breakfast.  08/20/19  Yes [provider]  metoprolol tartrate (LOPRESSOR) 25 MG tablet Take 12.5 mg by mouth 2 (two) times daily.   Yes [provider]  OXYGEN Inhale 2-3 L/min into the lungs continuous.  01/02/19  Yes [provider]  pantoprazole (PROTONIX) 40 MG tablet Take 40 mg by mouth daily. 01/07/19  Yes [provider]  potassium chloride SA (K-DUR,KLOR-CON) 20 MEQ tablet Take 20 mEq by mouth daily. 01/07/19  Yes [provider]  torsemide (DEMADEX) 20 MG tablet Take 20 mg by mouth daily. 01/13/19  Yes [provider]    Physical Exam: Vitals:   03/16/20 1730 03/16/20 1800 03/16/20 1822 03/16/20 1837  BP: 136/63 (!) 123/59 129/64   Pulse: 86 83 84   Resp: (!) 22 (!) 24 16   Temp:   97.8 F (36.6 C)   TempSrc:   Oral   SpO2: (!) 89% (!) 87% 93% 93%  Weight:      Height:        Constitutional: NAD, calm, comfortable Vitals:   03/16/20 1730 03/16/20 1800 03/16/20 1822 03/16/20 1837  BP: 136/63 (!) 123/59 129/64   Pulse: 86 83 84   Resp: (!) 22 (!) 24 16   Temp:   97.8 F (36.6 C)   TempSrc:   Oral   SpO2: (!) 89% (!) 87% 93% 93%  Weight:      Height:       Eyes: PERRL, lids and conjunctivae normal ENMT: Mucous membranes are moist. Neck: normal, supple, no masses, no thyromegaly Respiratory: Bilateral bases, otherwise no appreciable wheezing or rhonchi, Normal respiratory effort. No accessory muscle use.  Cardiovascular: Regular rate and rhythm, no murmurs / rubs / gallops. No extremity edema. 2+ pedal pulses. Abdomen: no tenderness, no masses palpated. No hepatosplenomegaly. Bowel sounds positive.  Musculoskeletal: no clubbing / cyanosis. No joint deformity upper and lower extremities. Good ROM, no contractures. Normal muscle tone.  Skin: no rashes, lesions, ulcers. No induration Neurologic: No apparent cranial nerve abnormality, 4+ /5 strength in all extremities. Psychiatric: Normal judgment and insight. Alert and oriented x 3. Normal mood.   Labs on Admission: I have personally reviewed following labs and imaging studies  CBC: Recent Labs  Lab 03/16/20 1511  WBC 11.2*  NEUTROABS 7.9*  HGB 11.6*  HCT 38.3  MCV 98.5  PLT 361   Basic Metabolic Panel: Recent Labs  Lab 03/16/20 1511  NA 138  K 4.6  CL 87*  CO2 39*  GLUCOSE 93  BUN 49*  CREATININE 1.56*  CALCIUM 9.1    Radiological Exams on Admission: DG Chest Portable 1 View  Result Date: 03/16/2020 CLINICAL DATA:  Cough and weakness. EXAM: PORTABLE CHEST 1 VIEW COMPARISON:  Chest x-ray 12/25/2019 and  chest CT 10/28/2019 FINDINGS: The cardiac silhouette, mediastinal and hilar contours are within normal limits and stable. Stable mild tortuosity and calcification of the thoracic aorta. Stable emphysematous changes and pulmonary scarring. Stable fairly extensive bronchiectasis. There are chronic bronchitic type interstitial lung changes. No definite infiltrates or effusions. No worrisome pulmonary lesions or pleural effusion. The bony structures are intact IMPRESSION: 1. Chronic lung changes with emphysema, bronchiectasis and chronic bronchitic type interstitial lung changes. 2. No definite infiltrates or effusions. Electronically Signed   By: Rudie Meyer M.D.   On: 03/16/2020 16:12    EKG: Independently reviewed.  Sinus rhythm incomplete right bundle branch block, artifact also present.  QTc 477.  No old EKG to compare.  Assessment/Plan Principal Problem:   COPD  exacerbation (HCC) Active Problems:   Atrial fibrillation with RVR (HCC)   Essential hypertension   Acute on chronic respiratory failure with hypoxia (HCC)    COPD/ exacerbation with acute on chronic hypoxic respiratory failure-on 2 L O2 at baseline, O2 sats down to 83% on 3 L.  Currently on 4 L.  WBC 11.2.  Portable chest x-ray- chronic lung changes with emphysema, bronchiectasis and chronic bronchitic type interstitial lung changes.  COVID-19 test negative. -DuoNeb scheduled, as needed -Mucolytic's, supplemental oxygen -IV azithromycin -IV Solu-Medrol 40 every 12 hourly  Acute kidney injury-mild, creatinine 1.56, recent baseline ~1.1.  At this time appears euvolemic.  No GI losses, reports poor p.o. intake. Compliant with torsemide, last dose today. -1 L bolus given in ED, Hold torsemide for now  History of atrial fibrillation-currently in sinus, rate controlled but not on anticoagulation due to concerns for GI bleed. -Resume metoprolol  Hypertension-stable. -Resume Home metoprolol  Protein energy malnutrition-BMI  12.7 -Ensure twice daily  DVT prophylaxis: heparin Code Status: Full code Family Communication: None at bedside. Disposition Plan: ~ 1 - 2 days, pending improvement in respiratory status. Consults called: None Admission status: Observation, telemetry   Onnie Boer MD [facility]  03/16/2020, 9:06 PM

## 2020-03-16 NOTE — ED Notes (Signed)
Pt ambulated on 4 liters of O2. O2 stayed at 93 while ambulating. Pt short of breath and shaking when returning to room.

## 2020-03-16 NOTE — ED Triage Notes (Signed)
Pt c/o generalized weakness, cough, and sob worse for the past 4-5 days.  Denies any pain.  NP called and reports pt usually on home o2 at 2liters and increased to 3 liters today.  Reports o2 sat was 86% on 3liters.  Reports sat drops worse with any exertion.

## 2020-03-16 NOTE — ED Notes (Signed)
ED TO INPATIENT HANDOFF REPORT  ED Nurse Name and Phone #: Wilkie Aye [phone]  S Name/Age/Gender Brandy Carter 86 y.o. female Room/Bed: APA07/APA07  Code Status   Code Status: Prior  Home/SNF/Other Home Patient oriented to: situation Is this baseline? Yes   Triage Complete: Triage complete  Chief Complaint COPD exacerbation (HCC) [J44.1]  Triage Note Pt c/o generalized weakness, cough, and sob worse for the past 4-5 days.  Denies any pain.  NP called and reports pt usually on home o2 at 2liters and increased to 3 liters today.  Reports o2 sat was 86% on 3liters.  Reports sat drops worse with any exertion.    Allergies Allergies  Allergen Reactions  . Vibramycin [Doxycycline Calcium] Hives    Facial redness    Level of Care/Admitting Diagnosis ED Disposition    ED Disposition Condition Comment   Admit  Hospital Area: [facility] [[id number]]  Level of Care: Telemetry [5]  Covid Evaluation: Confirmed COVID Negative  Diagnosis: COPD exacerbation [facility]) [[id number]]  Admitting Physician: Cresenciano Lick  Attending Physician: Onnie Boer Xenia.Douglas       B Medical/Surgery History Past Medical History:  Diagnosis Date  . Allergy   . Anxiety   . Asthma   . Atrial fibrillation with RVR (HCC)   . Bronchiectasis (HCC)   . COPD (chronic obstructive pulmonary disease) (HCC)   . Hypertension   . On home oxygen therapy   . Parapneumonic effusion    s/p chest tube 12/2018  . Severe protein-calorie malnutrition (HCC)   . Shingles    Jan 2020; on back   . SVT (supraventricular tachycardia) (HCC)    Past Surgical History:  Procedure Laterality Date  . APPENDECTOMY    . BREAST SURGERY    . CATARACT EXTRACTION    . PILONIDAL CYST / SINUS EXCISION       A IV Location/Drains/Wounds Patient Lines/Drains/Airways Status   Active Line/Drains/Airways    Name:   Placement date:   Placement time:   Site:   Days:   Peripheral IV 03/16/20 Right  Antecubital   03/16/20    1453    Antecubital   less than 1   External Urinary Catheter   10/30/19    0900    --   138          Intake/Output Last 24 hours No intake or output data in the 24 hours ending 03/16/20 2036  Labs/Imaging Results for orders placed or performed during the hospital encounter of 03/16/20 (from the past 48 hour(s))  CBC with Differential     Status: Abnormal   Collection Time: 03/16/20  3:11 PM  Result Value Ref Range   WBC 11.2 (H) 4.0 - 10.5 K/uL   RBC 3.89 3.87 - 5.11 MIL/uL   Hemoglobin 11.6 (L) 12.0 - 15.0 g/dL   HCT 44.0 34.7 - 42.5 %   MCV 98.5 80.0 - 100.0 fL   MCH 29.8 26.0 - 34.0 pg   MCHC 30.3 30.0 - 36.0 g/dL   RDW 95.6 38.7 - 56.4 %   Platelets 361 150 - 400 K/uL   nRBC 0.0 0.0 - 0.2 %   Neutrophils Relative % 71 %   Neutro Abs 7.9 (H) 1.7 - 7.7 K/uL   Lymphocytes Relative 17 %   Lymphs Abs 1.9 0.7 - 4.0 K/uL   Monocytes Relative 8 %   Monocytes Absolute 0.9 0.1 - 1.0 K/uL   Eosinophils Relative 1 %   Eosinophils Absolute 0.2  0.0 - 0.5 K/uL   Basophils Relative 1 %   Basophils Absolute 0.1 0.0 - 0.1 K/uL   Immature Granulocytes 2 %   Abs Immature Granulocytes 0.23 (H) 0.00 - 0.07 K/uL    Comment: Performed at [facility], [address], [place], [place] [address]  Basic metabolic panel     Status: Abnormal   Collection Time: 03/16/20  3:11 PM  Result Value Ref Range   Sodium 138 135 - 145 mmol/L   Potassium 4.6 3.5 - 5.1 mmol/L   Chloride 87 (L) 98 - 111 mmol/L   CO2 39 (H) 22 - 32 mmol/L   Glucose, Bld 93 70 - 99 mg/dL    Comment: Glucose reference range applies only to samples taken after fasting for at least 8 hours.   BUN 49 (H) 8 - 23 mg/dL   Creatinine, Ser 1.56 (H) 0.44 - 1.00 mg/dL   Calcium 9.1 8.9 - 10.3 mg/dL   GFR calc non Af Amer 30 (L) >60 mL/min   GFR calc Af Amer 34 (L) >60 mL/min   Anion gap 12 5 - 15    Comment: Performed at [facility], [address] [place], [place] [address]  Brain natriuretic peptide      Status: Abnormal   Collection Time: 03/16/20  3:11 PM  Result Value Ref Range   B Natriuretic Peptide 138.0 (H) 0.0 - 100.0 pg/mL    Comment: Performed at [facility], [address], [place], [place] [address]  Troponin I (High Sensitivity)     Status: None   Collection Time: 03/16/20  3:11 PM  Result Value Ref Range   Troponin I (High Sensitivity) 11 <18 ng/L    Comment: (NOTE) Elevated high sensitivity troponin I (hsTnI) values and significant  changes across serial measurements may suggest ACS but many other  chronic and acute conditions are known to elevate hsTnI results.  Refer to the "Links" section for chest pain algorithms and additional  guidance. Performed at [facility], [address], [place], [place] [address]   SARS Coronavirus 2 by RT PCR (hospital order, performed in [facility] hospital lab) Nasopharyngeal Nasopharyngeal Swab     Status: None   Collection Time: 03/16/20  4:35 PM   Specimen: Nasopharyngeal Swab  Result Value Ref Range   SARS Coronavirus 2 NEGATIVE NEGATIVE    Comment: (NOTE) SARS-CoV-2 target nucleic acids are NOT DETECTED. The SARS-CoV-2 RNA is generally detectable in upper and lower respiratory specimens during the acute phase of infection. The lowest concentration of SARS-CoV-2 viral copies this assay can detect is 250 copies / mL. A negative result does not preclude SARS-CoV-2 infection and should not be used as the sole basis for treatment or other patient management decisions.  A negative result may occur with improper specimen collection / handling, submission of specimen other than nasopharyngeal swab, presence of viral mutation(s) within the areas targeted by this assay, and inadequate number of viral copies (<250 copies / mL). A negative result must be combined with clinical observations, patient history, and epidemiological information. Fact Sheet for Patients:   StrictlyIdeas.no Fact Sheet for Healthcare  Providers: BankingDealers.co.za This test is not yet approved or cleared  by the [place] FDA and has been authorized for detection and/or diagnosis of SARS-CoV-2 by FDA under an Emergency Use Authorization (EUA).  This EUA will remain in effect (meaning this test can be used) for the duration of the COVID-19 declaration under Section 564(b)(1) of the Act, 21 U.S.C. section  360bbb-3(b)(1), unless the authorization is terminated or revoked sooner. Performed at [facility], [address], [place], [place] [address]   Troponin I (High Sensitivity)     Status: None   Collection Time: 03/16/20  5:39 PM  Result Value Ref Range   Troponin I (High Sensitivity) 12 <18 ng/L    Comment: (NOTE) Elevated high sensitivity troponin I (hsTnI) values and significant  changes across serial measurements may suggest ACS but many other  chronic and acute conditions are known to elevate hsTnI results.  Refer to the "Links" section for chest pain algorithms and additional  guidance. Performed at [facility], [address], [place], [place] [address]    DG Chest Portable 1 View  Result Date: 03/16/2020 CLINICAL DATA:  Cough and weakness. EXAM: PORTABLE CHEST 1 VIEW COMPARISON:  Chest x-ray 12/25/2019 and chest CT 10/28/2019 FINDINGS: The cardiac silhouette, mediastinal and hilar contours are within normal limits and stable. Stable mild tortuosity and calcification of the thoracic aorta. Stable emphysematous changes and pulmonary scarring. Stable fairly extensive bronchiectasis. There are chronic bronchitic type interstitial lung changes. No definite infiltrates or effusions. No worrisome pulmonary lesions or pleural effusion. The bony structures are intact IMPRESSION: 1. Chronic lung changes with emphysema, bronchiectasis and chronic bronchitic type interstitial lung changes. 2. No definite infiltrates or effusions. Electronically Signed   By: Rudie Meyer M.D.   On: 03/16/2020 16:12     Pending Labs Unresulted Labs (From admission, onward)   None      Vitals/Pain Today's Vitals   03/16/20 1730 03/16/20 1800 03/16/20 1822 03/16/20 1837  BP: 136/63 (!) 123/59 129/64   Pulse: 86 83 84   Resp: (!) 22 (!) 24 16   Temp:   97.8 F (36.6 C)   TempSrc:   Oral   SpO2: (!) 89% (!) 87% 93% 93%  Weight:      Height:      PainSc:        Isolation Precautions No active isolations  Medications Medications  albuterol (VENTOLIN HFA) 108 (90 Base) MCG/ACT inhaler 2 puff (2 puffs Inhalation Given 03/16/20 1620)  methylPREDNISolone sodium succinate (SOLU-MEDROL) 125 mg/2 mL injection 60 mg (60 mg Intravenous Given 03/16/20 1618)  ipratropium-albuterol (DUONEB) 0.5-2.5 (3) MG/3ML nebulizer solution 3 mL (3 mLs Nebulization Given 03/16/20 1813)  sodium chloride 0.9 % bolus 1,000 mL (1,000 mLs Intravenous New Bag/Given 03/16/20 2002)    Mobility walks Low fall risk   Focused Assessments Pulmonary Assessment Handoff:  Lung sounds: Bilateral Breath Sounds: Diminished O2 Device: Nasal Cannula O2 Flow Rate (L/min): 3 L/min      R Recommendations: See Admitting Provider Note  Report given to:   Additional Notes: Pt is on 4lpm

## 2020-03-16 NOTE — ED Provider Notes (Signed)
[facility] EMERGENCY DEPARTMENT Provider Note   CSN: [id number] Arrival date & time: 03/16/20  1413     History Chief Complaint  Patient presents with  . Shortness of Breath    Brandy Carter is a 86 y.o. female.  HPI   86 year old female with history of allergies, anxiety, asthma/COPD, bronchiectasis, A. fib with RVR, who presents to the emergency department today for evaluation of shortness of breath.  States she has had chronic shortness of breath for many years but symptoms worsened about 4 to 5 days ago.  She is also had a productive cough with yellow/dark sputum.  Denies hemoptysis.  She denies any chest pain.  She is normally on 2 L of O2.  States that whenever she ambulates her oxygen has been dropping into the 70s.  States that since she normally has her oxygen drop when ambulating but it does not normally drop that low.  She denies any calf swelling or calf pain.  She denies any fevers.  Denies any known Covid exposures.  Past Medical History:  Diagnosis Date  . Allergy   . Anxiety   . Asthma   . Atrial fibrillation with RVR ([place])   . Bronchiectasis ([place])   . COPD (chronic obstructive pulmonary disease) ([place])   . Hypertension   . On home oxygen therapy   . Parapneumonic effusion    s/p chest tube 12/2018  . Severe protein-calorie malnutrition ([place])   . Shingles    Jan 2020; on back   . SVT (supraventricular tachycardia) [facility])     Patient Active Problem List   Diagnosis Date Noted  . Acute on chronic respiratory failure with hypoxia ([place]) 10/30/2019  . Viral pneumonia 10/29/2019  . COPD exacerbation ([place]) 10/28/2019  . Heme positive stool 01/27/2019  . Normocytic anemia 01/27/2019  . GI bleed 01/12/2019  . Bilateral leg edema 01/08/2019  . Hyponatremia 01/06/2019  . Atrial fibrillation with RVR ([place]) 01/02/2019  . Post herpetic neuralgia 01/02/2019  . Bronchiectasis without complication ([place]) 84/13/2440  . COPD (chronic obstructive pulmonary disease) with  emphysema ([place]) 01/02/2019  . Chronic respiratory failure with hypoxia ([place]) 01/02/2019  . Essential hypertension 01/02/2019  . Chronic anxiety 01/02/2019  . Severe protein-calorie malnutrition ([place]) 01/02/2019  . Failure to thrive in adult 01/02/2019  . Anemia of chronic disease 01/02/2019  . Bilateral pleural effusion 01/02/2019  . Community acquired pneumonia of right lung 01/02/2019  . Status post chest tube placement [facility]) 01/02/2019    Past Surgical History:  Procedure Laterality Date  . APPENDECTOMY    . BREAST SURGERY    . CATARACT EXTRACTION    . PILONIDAL CYST / SINUS EXCISION       OB History    Gravida      Para      Term      Preterm      AB      Living  1     SAB      TAB      Ectopic      Multiple      Live Births              Family History  Problem Relation Age of Onset  . Hypertension Sister   . Heart disease Brother     Social History   Tobacco Use  . Smoking status: Never Smoker  . Smokeless tobacco: Never Used  Substance Use Topics  . Alcohol use: Never  . Drug use: Never  Home Medications Prior to Admission medications   Medication Sig Start Date End Date Taking? Authorizing Provider  acetaminophen (TYLENOL) 500 MG tablet Take 500 mg by mouth every 6 (six) hours as needed for mild pain.    Yes [provider]  ALPRAZolam (XANAX) 0.25 MG tablet Take 0.125 mg by mouth at bedtime.  10/02/19  Yes [provider]  cholecalciferol (VITAMIN D3) 25 MCG (1000 UT) tablet Take 1,000 Units by mouth daily.   Yes [provider]  Cyanocobalamin (VITAMIN B 12) 100 MCG LOZG Take 1 tablet by mouth daily.   Yes [provider]  feeding supplement, ENSURE ENLIVE, (ENSURE ENLIVE) LIQD Take 237 mLs by mouth 2 (two) times daily between meals. 10/30/19  Yes Hollice Espy, MD  ipratropium-albuterol (DUONEB) 0.5-2.5 (3) MG/3ML SOLN Inhale 3 mLs into the lungs every 6 (six) hours as needed (FOR SHORTNESS OF  BREATH/WHEEZING).  02/17/20  Yes [provider]  levothyroxine (SYNTHROID) 50 MCG tablet Take 50 mcg by mouth daily before breakfast.  08/20/19  Yes [provider]  metoprolol tartrate (LOPRESSOR) 25 MG tablet Take 12.5 mg by mouth 2 (two) times daily.   Yes [provider]  OXYGEN Inhale 2-3 L/min into the lungs continuous.  01/02/19  Yes [provider]  pantoprazole (PROTONIX) 40 MG tablet Take 40 mg by mouth daily. 01/07/19  Yes [provider]  potassium chloride SA (K-DUR,KLOR-CON) 20 MEQ tablet Take 20 mEq by mouth daily. 01/07/19  Yes [provider]  torsemide (DEMADEX) 20 MG tablet Take 20 mg by mouth daily. 01/13/19  Yes [provider]    Allergies    Vibramycin [doxycycline calcium]  Review of Systems   Review of Systems  Constitutional: Negative for chills and fever.  HENT: Negative for ear pain and sore throat.   Eyes: Negative for pain and visual disturbance.  Respiratory: Positive for shortness of breath. Negative for cough.   Cardiovascular: Negative for chest pain and leg swelling.  Gastrointestinal: Negative for abdominal pain, constipation, diarrhea, nausea and vomiting.  Genitourinary: Negative for dysuria and hematuria.  Musculoskeletal: Negative for back pain.  Skin: Negative for rash.  Neurological: Negative for headaches.  All other systems reviewed and are negative.   Physical Exam Updated Vital Signs BP 129/64 (BP Location: Left Arm)   Pulse 84   Temp 97.8 F (36.6 C) (Oral)   Resp 16   Ht 5\' 6"  (1.676 m)   Wt 35.8 kg   SpO2 93%   BMI 12.75 kg/m   Physical Exam Vitals and nursing note reviewed.  Constitutional:      General: She is not in acute distress.    Appearance: She is well-developed.  HENT:     Head: Normocephalic and atraumatic.  Eyes:     Conjunctiva/sclera: Conjunctivae normal.  Cardiovascular:     Rate and Rhythm: Normal rate and regular rhythm.     Heart sounds: Normal  heart sounds. No murmur.  Pulmonary:     Effort: Pulmonary effort is normal. Tachypnea present. No respiratory distress.     Breath sounds: Examination of the right-middle field reveals rales. Examination of the right-lower field reveals rales. Examination of the left-lower field reveals wheezing and rales. Wheezing and rales present.  Abdominal:     Palpations: Abdomen is soft.     Tenderness: There is no abdominal tenderness.  Musculoskeletal:     Cervical back: Neck supple.     Right lower leg: No tenderness. No edema.  Left lower leg: No tenderness. Edema (to the left foot only) present.  Skin:    General: Skin is warm and dry.  Neurological:     Mental Status: She is alert.     ED Results / Procedures / Treatments   Labs (all labs ordered are listed, but only abnormal results are displayed) Labs Reviewed  CBC WITH DIFFERENTIAL/PLATELET - Abnormal; Notable for the following components:      Result Value   WBC 11.2 (*)    Hemoglobin 11.6 (*)    Neutro Abs 7.9 (*)    Abs Immature Granulocytes 0.23 (*)    All other components within normal limits  BASIC METABOLIC PANEL - Abnormal; Notable for the following components:   Chloride 87 (*)    CO2 39 (*)    BUN 49 (*)    Creatinine, Ser 1.56 (*)    GFR calc non Af Amer 30 (*)    GFR calc Af Amer 34 (*)    All other components within normal limits  BRAIN NATRIURETIC PEPTIDE - Abnormal; Notable for the following components:   B Natriuretic Peptide 138.0 (*)    All other components within normal limits  SARS CORONAVIRUS 2 BY RT PCR (HOSPITAL ORDER, PERFORMED IN [facility] LAB)  TROPONIN I (HIGH SENSITIVITY)  TROPONIN I (HIGH SENSITIVITY)    EKG EKG Interpretation  Date/Time:  Wednesday March 16 2020 15:34:00 EDT Ventricular Rate:  69 PR Interval:    QRS Duration: 115 QT Interval:  445 QTC Calculation: 477 R Axis:   99 Text Interpretation: Sinus rhythm artifact and baseline wander preclude complete  interpetation No definite ischemic changes No old tracing to compare Confirmed by Susy Frizzle [phone]) on 03/16/2020 6:39:03 PM   Radiology DG Chest Portable 1 View  Result Date: 03/16/2020 CLINICAL DATA:  Cough and weakness. EXAM: PORTABLE CHEST 1 VIEW COMPARISON:  Chest x-ray 12/25/2019 and chest CT 10/28/2019 FINDINGS: The cardiac silhouette, mediastinal and hilar contours are within normal limits and stable. Stable mild tortuosity and calcification of the thoracic aorta. Stable emphysematous changes and pulmonary scarring. Stable fairly extensive bronchiectasis. There are chronic bronchitic type interstitial lung changes. No definite infiltrates or effusions. No worrisome pulmonary lesions or pleural effusion. The bony structures are intact IMPRESSION: 1. Chronic lung changes with emphysema, bronchiectasis and chronic bronchitic type interstitial lung changes. 2. No definite infiltrates or effusions. Electronically Signed   By: Rudie Meyer M.D.   On: 03/16/2020 16:12    Procedures Procedures (including critical care time)  Medications Ordered in ED Medications  albuterol (VENTOLIN HFA) 108 (90 Base) MCG/ACT inhaler 2 puff (2 puffs Inhalation Given 03/16/20 1620)  methylPREDNISolone sodium succinate (SOLU-MEDROL) 125 mg/2 mL injection 60 mg (60 mg Intravenous Given 03/16/20 1618)  ipratropium-albuterol (DUONEB) 0.5-2.5 (3) MG/3ML nebulizer solution 3 mL (3 mLs Nebulization Given 03/16/20 1813)  sodium chloride 0.9 % bolus 1,000 mL (1,000 mLs Intravenous New Bag/Given 03/16/20 2002)    ED Course  I have reviewed the triage vital signs and the nursing notes.  Pertinent labs & imaging results that were available during my care of the patient were reviewed by me and considered in my medical decision making (see chart for details).    MDM Rules/Calculators/A&P                      86 year old female presenting for evaluation of shortness of breath that started about 4 to 5 days ago.  Associated  with a productive cough.  Oxygen reportedly has been low at home on her home oxygen requirement.  Reviewed prior records.  Echo completed 12/2018 which showed preserved EF.  Mild aortic, mitral, and tricuspid regurg present.  Elevated RVP consistent with moderate pulmonary hypertension.  Reviewed/interpreted labs CBC with mild leukocytosis, mild anemia which is improved from prior BMP with elevated creatinine at 1.5, elevated BUN as well.  CO2 is elevated but this appears to be chronic for patient. BNP marginally elevated Trop negative Covid negative  EKG Sinus rhythm artifact and baseline wander preclude complete interpetation No definite ischemic changes No old tracing to compare   Chest x-ray with chronic lung changes with emphysema, bronchiectasis and chronic bronchitic type interstitial lung changes. No definite infiltrates or effusions.  Patient given albuterol, Atrovent, Solu-Medrol.  Feel she will require admission for further treatment of COPD exacerbation and dehydration with AKI.   CONSULT With  Dr. Mariea Clonts with hospitalist service who accepts patient for admission.   Final Clinical Impression(s) / ED Diagnoses Final diagnoses:  COPD exacerbation (HCC)  AKI (acute kidney injury) [facility])    Rx / DC Orders ED Discharge Orders    None       Rayne Du 03/16/20 2029    Pollyann Savoy, MD 03/16/20 [phone]

## 2020-03-17 DIAGNOSIS — E039 Hypothyroidism, unspecified: Secondary | ICD-10-CM | POA: Diagnosis present

## 2020-03-17 DIAGNOSIS — Z681 Body mass index (BMI) 19 or less, adult: Secondary | ICD-10-CM | POA: Diagnosis not present

## 2020-03-17 DIAGNOSIS — Z79890 Hormone replacement therapy (postmenopausal): Secondary | ICD-10-CM | POA: Diagnosis not present

## 2020-03-17 DIAGNOSIS — N179 Acute kidney failure, unspecified: Secondary | ICD-10-CM | POA: Diagnosis present

## 2020-03-17 DIAGNOSIS — J441 Chronic obstructive pulmonary disease with (acute) exacerbation: Secondary | ICD-10-CM

## 2020-03-17 DIAGNOSIS — Z9981 Dependence on supplemental oxygen: Secondary | ICD-10-CM | POA: Diagnosis not present

## 2020-03-17 DIAGNOSIS — N1832 Chronic kidney disease, stage 3b: Secondary | ICD-10-CM | POA: Diagnosis present

## 2020-03-17 DIAGNOSIS — Z79899 Other long term (current) drug therapy: Secondary | ICD-10-CM | POA: Diagnosis not present

## 2020-03-17 DIAGNOSIS — I480 Paroxysmal atrial fibrillation: Secondary | ICD-10-CM | POA: Diagnosis present

## 2020-03-17 DIAGNOSIS — Z20822 Contact with and (suspected) exposure to covid-19: Secondary | ICD-10-CM | POA: Diagnosis present

## 2020-03-17 DIAGNOSIS — J479 Bronchiectasis, uncomplicated: Secondary | ICD-10-CM | POA: Diagnosis present

## 2020-03-17 DIAGNOSIS — J439 Emphysema, unspecified: Secondary | ICD-10-CM | POA: Diagnosis present

## 2020-03-17 DIAGNOSIS — E440 Moderate protein-calorie malnutrition: Secondary | ICD-10-CM | POA: Diagnosis present

## 2020-03-17 DIAGNOSIS — I129 Hypertensive chronic kidney disease with stage 1 through stage 4 chronic kidney disease, or unspecified chronic kidney disease: Secondary | ICD-10-CM | POA: Diagnosis present

## 2020-03-17 DIAGNOSIS — J9621 Acute and chronic respiratory failure with hypoxia: Secondary | ICD-10-CM | POA: Diagnosis present

## 2020-03-17 DIAGNOSIS — Z8249 Family history of ischemic heart disease and other diseases of the circulatory system: Secondary | ICD-10-CM | POA: Diagnosis not present

## 2020-03-17 MED ORDER — METHYLPREDNISOLONE SODIUM SUCC 40 MG IJ SOLR
40.0000 mg | Freq: Three times a day (TID) | INTRAMUSCULAR | Status: DC
  Administered 2020-03-17 – 2020-03-18 (×3): 40 mg via INTRAVENOUS
  Filled 2020-03-17 (×4): qty 1

## 2020-03-17 MED ORDER — ORAL CARE MOUTH RINSE: 15.0000 mL | Freq: Two times a day (BID) | OROMUCOSAL | Status: DC

## 2020-03-17 MED ORDER — CHLORHEXIDINE GLUCONATE 0.12 % MT SOLN
15.0000 mL | Freq: Two times a day (BID) | OROMUCOSAL | Status: DC
  Administered 2020-03-17: 15 mL via OROMUCOSAL
  Filled 2020-03-17: qty 15

## 2020-03-17 MED ORDER — GUAIFENESIN ER 600 MG PO TB12
600.0000 mg | ORAL_TABLET | Freq: Two times a day (BID) | ORAL | Status: DC
  Administered 2020-03-17 – 2020-03-18 (×3): 600 mg via ORAL
  Filled 2020-03-17 (×3): qty 1

## 2020-03-17 NOTE — Progress Notes (Signed)
Patient Demographics:    Brandy Carter, is a 86 y.o. female, DOB - September 25, 1931, KDX:[id number]  Admit date - 03/16/2020   Admitting Physician Ejiroghene Wendall Stade, MD  Outpatient Primary MD for the patient is Medicine, [facility]  LOS - 0   Chief Complaint  Patient presents with  . Shortness of Breath        Subjective:    Brandy Carter today has no fevers, no emesis,  No chest pain,   Dyspnea, cough, wheezing persist, increased oxygen requirement persist, -becomes very dyspneic with minimal activity  Assessment  & Plan :    Principal Problem:   COPD exacerbation (HCC) Active Problems:   Atrial fibrillation with RVR (HCC)   Essential hypertension   Acute on chronic respiratory failure with hypoxia (HCC)    Brief Summary:- Brandy Carter is a 86 y.o. female with medical history significant for  COPD with chronic hypoxic respiratory failure, bronchiectasis, hypertension atrial fibrillation. Admitted on 03/16/2020 with acute on chronic hypoxic respiratory failure secondary to COPD exacerbation with increased oxygen requirement  A/p 1)Acute on chronic hypoxic respiratory failure--at baseline patient uses 2 L of oxygen, currently requiring 3  L of oxygen -Treat COPD exacerbation as below in #2 -Covid negative  2)Acute COPD exacerbation/bronchiectasis flareup--no definite pneumonia --Dyspnea, cough, wheezing persist, increased oxygen requirement persist, -IV Solu-Medrol as ordered, azithromycin as ordered, mucolytics bronchodilators and supplemental oxygen as ordered  3)PAFib-continue metoprolol 12.5 mg twice daily for rate control, patient is not on anticoagulation due to bleeding concerns  4)AKI----acute kidney injury on CKD stage - IIIb   -Suspect worsening creatinine is due to dehydration creatinine on admission=1.56, baseline creatinine = 1.18   ,   renally adjust  medications, avoid nephrotoxic agents / dehydration  / hypotension   5)Hypothyroidism--continue levothyroxine 50 mcg daily   6)Moderate PCM--- nutritional supplementation with Ensure as ordered  Disposition/Need for in-Hospital Stay- patient unable to be discharged at this time due to persistent dyspnea, cough, wheezing persist, increased oxygen requirement persist----remains pretty asymptomatic from a respiratory standpoint--requiring IV Solu-Medrol, IV azithromycin and oxygen supplementation Status is: Observation----== transition to inpatient  Dispo: The patient is from: Home              Anticipated d/c is to: Home              Anticipated d/c date is: 1 day              Patient currently is not medically stable to d/c. Barriers: Not Clinically Stable- - Code Status : Full   Family Communication:   NA (patient is alert, awake and coherent)  Consults  :  na  DVT Prophylaxis  :    - Heparin - SCDs  Lab Results  Component Value Date   PLT 361 03/16/2020    Inpatient Medications  Scheduled Meds: . ALPRAZolam  0.125 mg Oral QHS  . chlorhexidine  15 mL Mouth Rinse BID  . feeding supplement (ENSURE ENLIVE)  1 Bottle Oral BID BM  . guaiFENesin  600 mg Oral BID  . guaiFENesin-dextromethorphan  10 mL Oral Q8H  . heparin  5,000 Units Subcutaneous Q8H  . ipratropium-albuterol  3 mL Nebulization Q6H  . levothyroxine  50 mcg Oral Q0600  .  mouth rinse  15 mL Mouth Rinse q12n4p  . methylPREDNISolone (SOLU-MEDROL) injection  40 mg Intravenous Q12H  . metoprolol tartrate  12.5 mg Oral BID  . pantoprazole  40 mg Oral Daily   Continuous Infusions: . azithromycin Stopped (03/17/20 0013)   PRN Meds:.acetaminophen **OR** acetaminophen, ipratropium-albuterol, ondansetron **OR** ondansetron (ZOFRAN) IV, polyethylene glycol    Anti-infectives (From admission, onward)   Start     Dose/Rate Route Frequency Ordered Stop   03/16/20 2230  azithromycin (ZITHROMAX) 500 mg in sodium chloride 0.9  % 250 mL IVPB     500 mg 250 mL/hr over 60 Minutes Intravenous Daily at bedtime 03/16/20 2223          Objective:   Vitals:   03/17/20 0247 03/17/20 0454 03/17/20 0727 03/17/20 0833  BP:  114/60  (!) 116/59  Pulse:  64  77  Resp:  18  18  Temp:  98.2 F (36.8 C)  98.4 F (36.9 C)  TempSrc:  Oral  Oral  SpO2: 99% 94% 97% 93%  Weight:      Height:        Wt Readings from Last 3 Encounters:  03/16/20 39.1 kg  10/29/19 38.4 kg  01/20/19 43.4 kg     Intake/Output Summary (Last 24 hours) at 03/17/2020 1148 Last data filed at 03/17/2020 1100 Gross per 24 hour  Intake 490 ml  Output 500 ml  Net -10 ml     Physical Exam  Gen:- Awake Alert, speaking in short sentences, becomes very dyspneic with minimal activity HEENT:- [place].AT, No sclera icterus Nose- [place] 3L/min Neck-Supple Neck,No JVD,.  Lungs-diminished in bases, few scattered wheezes CV- S1, S2 normal, irregular  Abd-  +ve B.Sounds, Abd Soft, No tenderness,    Extremity/Skin:- No  edema, pedal pulses present  Psych-affect is appropriate, oriented x3 Neuro-generalized weakness, no new focal deficits, no tremors   Data Review:   Micro Results Recent Results (from the past 240 hour(s))  SARS Coronavirus 2 by RT PCR (hospital order, performed in [facility] hospital lab) Nasopharyngeal Nasopharyngeal Swab     Status: None   Collection Time: 03/16/20  4:35 PM   Specimen: Nasopharyngeal Swab  Result Value Ref Range Status   SARS Coronavirus 2 NEGATIVE NEGATIVE Final    Comment: (NOTE) SARS-CoV-2 target nucleic acids are NOT DETECTED. The SARS-CoV-2 RNA is generally detectable in upper and lower respiratory specimens during the acute phase of infection. The lowest concentration of SARS-CoV-2 viral copies this assay can detect is 250 copies / mL. A negative result does not preclude SARS-CoV-2 infection and should not be used as the sole basis for treatment or other patient management decisions.  A negative result may  occur with improper specimen collection / handling, submission of specimen other than nasopharyngeal swab, presence of viral mutation(s) within the areas targeted by this assay, and inadequate number of viral copies (<250 copies / mL). A negative result must be combined with clinical observations, patient history, and epidemiological information. Fact Sheet for Patients:   BoilerBrush.com.cy Fact Sheet for Healthcare Providers: https://pope.com/ This test is not yet approved or cleared  by the [place] FDA and has been authorized for detection and/or diagnosis of SARS-CoV-2 by FDA under an Emergency Use Authorization (EUA).  This EUA will remain in effect (meaning this test can be used) for the duration of the COVID-19 declaration under Section 564(b)(1) of the Act, 21 U.S.C. section 360bbb-3(b)(1), unless the authorization is terminated or revoked sooner. Performed at [facility],  [address] [place], [place] [address]     Radiology Reports DG Chest Portable 1 View  Result Date: 03/16/2020 CLINICAL DATA:  Cough and weakness. EXAM: PORTABLE CHEST 1 VIEW COMPARISON:  Chest x-ray 12/25/2019 and chest CT 10/28/2019 FINDINGS: The cardiac silhouette, mediastinal and hilar contours are within normal limits and stable. Stable mild tortuosity and calcification of the thoracic aorta. Stable emphysematous changes and pulmonary scarring. Stable fairly extensive bronchiectasis. There are chronic bronchitic type interstitial lung changes. No definite infiltrates or effusions. No worrisome pulmonary lesions or pleural effusion. The bony structures are intact IMPRESSION: 1. Chronic lung changes with emphysema, bronchiectasis and chronic bronchitic type interstitial lung changes. 2. No definite infiltrates or effusions. Electronically Signed   By: Marijo Sanes M.D.   On: 03/16/2020 16:12     CBC Recent Labs  Lab 03/16/20 1511  WBC 11.2*  HGB  11.6*  HCT 38.3  PLT 361  MCV 98.5  MCH 29.8  MCHC 30.3  RDW 15.4  LYMPHSABS 1.9  MONOABS 0.9  EOSABS 0.2  BASOSABS 0.1    Chemistries  Recent Labs  Lab 03/16/20 1511  NA 138  K 4.6  CL 87*  CO2 39*  GLUCOSE 93  BUN 49*  CREATININE 1.56*  CALCIUM 9.1   ------------------------------------------------------------------------------------------------------------------ No results for input(s): CHOL, HDL, LDLCALC, TRIG, CHOLHDL, LDLDIRECT in the last 72 hours.  No results found for: HGBA1C ------------------------------------------------------------------------------------------------------------------ No results for input(s): TSH, T4TOTAL, T3FREE, THYROIDAB in the last 72 hours.  Invalid input(s): FREET3 ------------------------------------------------------------------------------------------------------------------ No results for input(s): VITAMINB12, FOLATE, FERRITIN, TIBC, IRON, RETICCTPCT in the last 72 hours.  Coagulation profile No results for input(s): INR, PROTIME in the last 168 hours.  No results for input(s): DDIMER in the last 72 hours.  Cardiac Enzymes No results for input(s): CKMB, TROPONINI, MYOGLOBIN in the last 168 hours.  Invalid input(s): CK ------------------------------------------------------------------------------------------------------------------    Component Value Date/Time   BNP 138.0 (H) 03/16/2020 1511     Roxan Hockey M.D on 03/17/2020 at 11:48 AM  Go to [email or web address] - for contact info  [facility] - Office  [phone]

## 2020-03-18 LAB — BASIC METABOLIC PANEL
Anion gap: 8 (ref 5–15)
BUN: 36 mg/dL — ABNORMAL HIGH (ref 8–23)
CO2: 36 mmol/L — ABNORMAL HIGH (ref 22–32)
Calcium: 8.6 mg/dL — ABNORMAL LOW (ref 8.9–10.3)
Chloride: 92 mmol/L — ABNORMAL LOW (ref 98–111)
Creatinine, Ser: 1.14 mg/dL — ABNORMAL HIGH (ref 0.44–1.00)
GFR calc Af Amer: 50 mL/min — ABNORMAL LOW (ref >60)
GFR calc non Af Amer: 43 mL/min — ABNORMAL LOW (ref >60)
Glucose, Bld: 128 mg/dL — ABNORMAL HIGH (ref 70–99)
Potassium: 4.5 mmol/L (ref 3.5–5.1)
Sodium: 136 mmol/L (ref 135–145)

## 2020-03-18 MED ORDER — GUAIFENESIN ER 600 MG PO TB12: 600.0000 mg | ORAL_TABLET | Freq: Two times a day (BID) | ORAL | 0 refills | Status: AC

## 2020-03-18 MED ORDER — METOPROLOL TARTRATE 25 MG PO TABS: 12.5000 mg | ORAL_TABLET | Freq: Two times a day (BID) | ORAL | 3 refills | Status: AC

## 2020-03-18 MED ORDER — ACETAMINOPHEN 325 MG PO TABS: 650.0000 mg | ORAL_TABLET | Freq: Four times a day (QID) | ORAL | 1 refills | Status: AC | PRN

## 2020-03-18 MED ORDER — AZITHROMYCIN 500 MG PO TABS
500.0000 mg | ORAL_TABLET | Freq: Every day | ORAL | 0 refills | Status: AC
Start: 2020-03-18 — End: 2020-03-21

## 2020-03-18 MED ORDER — PREDNISONE 20 MG PO TABS: 40.0000 mg | ORAL_TABLET | Freq: Every day | ORAL | 0 refills | Status: AC

## 2020-03-18 NOTE — Discharge Instructions (Signed)
1)Very low-salt diet advised 2)Weigh yourself daily, call if you gain more than 3 pounds in 1 day or more than 5 pounds in 1 week as your diuretic medications may need to be adjusted 3)Limit your Fluid  intake to no more than 50 ounces (1.5 Liters) per day 4)Repeat BMP in 1 week with PCP-- 5)Generalized weakness and Deconditioning----physical therapist will come to your house to help you with rehab and get stronger 6)you need oxygen at home at 2 L via nasal cannula continuously while awake and while asleep--- smoking or having open fires around oxygen can cause fire, significant injury and death

## 2020-03-18 NOTE — Progress Notes (Signed)
Initial Nutrition Assessment  DOCUMENTATION CODES:   Underweight(Suspected PCM)  INTERVENTION:  Continue Ensure Enlive po BID, each supplement provides 350 kcal and 20 grams of protein Magic cup BID with meals, each supplement provides 290 kcal and 9 grams of protein Dysphagia 3 (chopped meats)   NUTRITION DIAGNOSIS:   Increased nutrient needs related to catabolic illness(chronic hypoxic respiratory failure seconday to COPD) as evidenced by estimated needs.  GOAL:   Patient will meet greater than or equal to 90% of their needs    MONITOR:   PO intake, Labs, I & O's, Supplement acceptance, Weight trends  REASON FOR ASSESSMENT:   Malnutrition Screening Tool    ASSESSMENT:  RD working remotely.  86 year old female admitted for acute on chronic respiratory failure secondary to COPD exacerbation with increased oxygen requirement. Past medical history significant for COPD with chronic hypoxic respiratory failure, bronchiectasis, HTN, and atrial fibrillation with RVR.  Patient with persistent dyspnea and wheezing, cough, requiring increased oxygen requirement to 3 L from 2 L  baseline. Meal history reviewed, per flowsheets patient is eating 50-75% of meals at this time. Per protocol, she is provided Ensure supplement twice daily. Will continue to monitor for po intake of meals and supplements and will order Magic Cup with lunch and dinner meals. Patient noted speaking in short sentences, very dyspneic with minimal activity. Will also modify diet to dysphagia 3 (chopped meats) to decrease work of chewing foods to promote po intake of meals.   Non-pitting BLE edema noted per RN assessment. Current wt 86.02 lb Limited recent weight history for review, on 10/29/19 pt weighed 84.48 lbs. Weights fluctuated 96 lb - 196 lb in March and April of 2020. Patient has lost ~15 lbs (14.5%) over the past 14 months. Although weight loss is insignificant, patient appears chronically underweight per  weight history, and given history of COPD with chronic respiratory failure highly suspect malnutrition, however unable to identify at this time.  Medications reviewed and include: Methylprednisolone, Protonix IVPB: Zithromax  Labs reviewed   NUTRITION - FOCUSED PHYSICAL EXAM: Unable to complete at this time, RD working remotely.  Diet Order:   Diet Order            Diet Heart Room service appropriate? Yes; Fluid consistency: Thin  Diet effective now              EDUCATION NEEDS:   No education needs have been identified at this time  Skin:  Skin Assessment: Reviewed RN Assessment  Last BM:  6/01  Height:   Ht Readings from Last 1 Encounters:  03/16/20 5\' 6"  (1.676 m)    Weight:   Wt Readings from Last 1 Encounters:  03/16/20 39.1 kg    BMI:  Body mass index is 13.91 kg/m.  Estimated Nutritional Needs:   Kcal:  1370-1565 (35-40 kcal/kg)  Protein:  69-78  Fluid:  >/= 1 L/day   05/16/20, RD, LDN Clinical Nutrition After Hours/Weekend Pager # in Amion

## 2020-03-18 NOTE — Plan of Care (Signed)
  Problem: Acute Rehab PT Goals(only PT should resolve) Goal: Patient Will Transfer Sit To/From Stand Outcome: Progressing Flowsheets (Taken 03/18/2020 1112) Patient will transfer sit to/from stand: with supervision Goal: Pt Will Transfer Bed To Chair/Chair To Bed Outcome: Progressing Flowsheets (Taken 03/18/2020 1112) Pt will Transfer Bed to Chair/Chair to Bed: with supervision Goal: Pt Will Ambulate Outcome: Progressing Flowsheets (Taken 03/18/2020 1112) Pt will Ambulate:  50 feet  with min guard assist  with least restrictive assistive device Goal: Pt/caregiver will Perform Home Exercise Program Outcome: Progressing Flowsheets (Taken 03/18/2020 1112) Pt/caregiver will Perform Home Exercise Program:  For increased strengthening  For improved balance  Independently   11:12 AM, 03/18/20 Wyman Songster PT, DPT Physical Therapist at [facility]

## 2020-03-18 NOTE — Discharge Summary (Signed)
Brandy Carter, is a 86 y.o. female  DOB November 01, 1930  MRN [id number].  Admission date:  03/16/2020  Admitting Physician  Onnie Boer, MD  Discharge Date:  03/18/2020   Primary MD  Medicine, [facility]  Recommendations for primary care physician for things to follow:   1)Very low-salt diet advised 2)Weigh yourself daily, call if you gain more than 3 pounds in 1 day or more than 5 pounds in 1 week as your diuretic medications may need to be adjusted 3)Limit your Fluid  intake to no more than 50 ounces (1.5 Liters) per day 4)Repeat BMP in 1 week with PCP-- 5)Generalized weakness and Deconditioning    ----physical therapist will come to your house to help you with rehab and get stronger 6)you need oxygen at home at 2 L via nasal cannula continuously while awake and while asleep--- smoking or having open fires around oxygen can cause fire, significant injury and death  Admission Diagnosis  COPD exacerbation (HCC) [J44.1] AKI (acute kidney injury) (HCC) [N17.9]  Discharge Diagnosis  COPD exacerbation (HCC) [J44.1] AKI (acute kidney injury) (HCC) [N17.9]    Principal Problem:   COPD exacerbation (HCC) Active Problems:   Atrial fibrillation with RVR (HCC)   Essential hypertension   Acute on chronic respiratory failure with hypoxia (HCC)      Past Medical History:  Diagnosis Date  . Allergy   . Anxiety   . Asthma   . Atrial fibrillation with RVR (HCC)   . Bronchiectasis (HCC)   . COPD (chronic obstructive pulmonary disease) (HCC)   . Hypertension   . On home oxygen therapy   . Parapneumonic effusion    s/p chest tube 12/2018  . Severe protein-calorie malnutrition (HCC)   . Shingles    Jan 2020; on back   . SVT (supraventricular tachycardia) (HCC)     Past Surgical History:  Procedure Laterality Date  . APPENDECTOMY    . BREAST SURGERY    . CATARACT EXTRACTION    .  PILONIDAL CYST / SINUS EXCISION       HPI  from the history and physical done on the day of admission:   Chief Complaint: Difficulty breathing.   HPI: Brandy Carter is a 86 y.o. female with medical history significant for  COPD with chronic respiratory failure, bronchiectasis, hypertension atrial fibrillation. Patient presented to the ED with complaints of difficulty breathing over the past 4 to 5 days.  Also with productive cough, and generalized weakness.  At baseline she is on 2 L of oxygen but she has had to increase this to 3 L as she is still having difficulty breathing. No chest pain, no lower extremity swelling.  She reports compliance with her medications including torsemide.  No vomiting, no loose stools.  ED Course: In the ED O2 sats were 83% on 3 L, improved with 4 L O2.  WBC 11.2.  BNP 138.  Troponin flat is 12 X 2, mild creatinine elevation 1.56 from baseline about 1.1.  Portable chest x-ray  shows chronic lung changes with emphysema, bronchiectasis and chronic bronchitic type interstitial lung changes. 60 mg IV Solu-Medrol given, DuoNebs, hospitalist to admit for COPD exacerbation with acute on chronic respiratory failure.  Review of Systems: As per HPI all other systems reviewed and negative.     Hospital Course:    Brief Summary:- Syrita Crouseis a 86 y.o.femalewith medical history significant forCOPDwith chronic hypoxic respiratory failure,bronchiectasis, hypertension atrial fibrillation. Admitted on 03/16/2020 with acute on chronic hypoxic respiratory failure secondary to COPD exacerbation with increased oxygen requirement  A/p 1)Acute on chronic hypoxic respiratory failure--at baseline patient uses 2 L of oxygen,   -Oxygen requirement improved with treatment of COPD as below -Currently back to baseline  2)Acute COPD exacerbation/bronchiectasis flareup--no definite pneumonia --Dyspnea-has improved significantly -No productive cough today, no  wheezing -Patient was treated with IV Solu-Medrol, azithromycin, bronchodilators, mucolytics and supplemental oxygen -Okay to discharge on p.o. prednisone, p.o. azithromycin and Mucinex  3)PAFib-stable, continue metoprolol 12.5 mg twice daily for rate control, patient is not on anticoagulation due to bleeding concerns  4)AKI----acute kidney injury on CKD stage - IIIb   -Suspect worsening creatinine is due to dehydration  --creatinine on admission=1.56, baseline creatinine = 1.18   ,   renally adjust medications, avoid nephrotoxic agents / dehydration  / hypotension -AKI-has resolved -Creatinine back to baseline of 1.1   5)Hypothyroidism--continue levothyroxine 50 mcg daily   6)Moderate PCM--- nutritional supplementation with Ensure as ordered  7) generalized weakness and deconditioning--- physical therapy evaluation appreciated discharging home with home health PT  Disposition--- discharge home with home health physical therapy and home health aide as well as home O2   Code Status : Full   Family Communication:   Discussed with patient's son prior to discharge  Consults  :  na   Discharge Condition: Stable, oxygen requirement back to baseline  Follow UP  Follow-up Information    [place] Follow up.   Why: PT and aide services. Services will start Monday for PT and later in the week for the aide.  Contact information: [place] [phone]       Medicine, [facility]. Schedule an appointment as soon as possible for a visit in 1 week(s).   Specialty: Family Medicine Why: Repeat BMP blood test          Diet and Activity recommendation:  As advised  Discharge Instructions    Discharge Instructions    Call MD for:  difficulty breathing, headache or visual disturbances   Complete by: As directed    Call MD for:  persistant dizziness or light-headedness   Complete by: As directed    Call  MD for:  persistant nausea and vomiting   Complete by: As directed    Call MD for:  temperature >100.4   Complete by: As directed    Diet - low sodium heart healthy   Complete by: As directed    Discharge instructions   Complete by: As directed    1)Very low-salt diet advised 2)Weigh yourself daily, call if you gain more than 3 pounds in 1 day or more than 5 pounds in 1 week as your diuretic medications may need to be adjusted 3)Limit your Fluid  intake to no more than 50 ounces (1.5 Liters) per day 4)Repeat BMP in 1 week with PCP-- 5)Generalized weakness and Deconditioning----physical therapist will come to your house to help you with rehab and get stronger 6)you need oxygen at home at 2 L  via nasal cannula continuously while awake and while asleep--- smoking or having open fires around oxygen can cause fire, significant injury and death   Increase activity slowly   Complete by: As directed         Discharge Medications     Allergies as of 03/18/2020      Reactions   Vibramycin [doxycycline Calcium] Hives   Facial redness      Medication List    TAKE these medications   acetaminophen 325 MG tablet Commonly known as: TYLENOL Take 2 tablets (650 mg total) by mouth every 6 (six) hours as needed for mild pain (or Fever >/= 101). What changed:   medication strength  how much to take  reasons to take this   ALPRAZolam 0.25 MG tablet Commonly known as: XANAX Take 0.125 mg by mouth at bedtime.   azithromycin 500 MG tablet Commonly known as: ZITHROMAX Take 1 tablet (500 mg total) by mouth daily for 3 days.   cholecalciferol 25 MCG (1000 UNIT) tablet Commonly known as: VITAMIN D3 Take 1,000 Units by mouth daily.   feeding supplement (ENSURE ENLIVE) Liqd Take 237 mLs by mouth 2 (two) times daily between meals.   guaiFENesin 600 MG 12 hr tablet Commonly known as: MUCINEX Take 1 tablet (600 mg total) by mouth 2 (two) times daily.   ipratropium-albuterol 0.5-2.5 (3)  MG/3ML Soln Commonly known as: DUONEB Inhale 3 mLs into the lungs every 6 (six) hours as needed (FOR SHORTNESS OF BREATH/WHEEZING).   levothyroxine 50 MCG tablet Commonly known as: SYNTHROID Take 50 mcg by mouth daily before breakfast.   metoprolol tartrate 25 MG tablet Commonly known as: LOPRESSOR Take 0.5 tablets (12.5 mg total) by mouth 2 (two) times daily.   OXYGEN Inhale 2-3 L/min into the lungs continuous.   pantoprazole 40 MG tablet Commonly known as: PROTONIX Take 40 mg by mouth daily.   potassium chloride SA 20 MEQ tablet Commonly known as: KLOR-CON Take 20 mEq by mouth daily.   predniSONE 20 MG tablet Commonly known as: Deltasone Take 2 tablets (40 mg total) by mouth daily with breakfast.   torsemide 20 MG tablet Commonly known as: DEMADEX Take 20 mg by mouth daily.   Vitamin B 12 100 MCG Lozg Take 1 tablet by mouth daily.      Major procedures and Radiology Reports - PLEASE review detailed and final reports for all details, in brief -   DG Chest Portable 1 View  Result Date: 03/16/2020 CLINICAL DATA:  Cough and weakness. EXAM: PORTABLE CHEST 1 VIEW COMPARISON:  Chest x-ray 12/25/2019 and chest CT 10/28/2019 FINDINGS: The cardiac silhouette, mediastinal and hilar contours are within normal limits and stable. Stable mild tortuosity and calcification of the thoracic aorta. Stable emphysematous changes and pulmonary scarring. Stable fairly extensive bronchiectasis. There are chronic bronchitic type interstitial lung changes. No definite infiltrates or effusions. No worrisome pulmonary lesions or pleural effusion. The bony structures are intact IMPRESSION: 1. Chronic lung changes with emphysema, bronchiectasis and chronic bronchitic type interstitial lung changes. 2. No definite infiltrates or effusions. Electronically Signed   By: Rudie Meyer M.D.   On: 03/16/2020 16:12    Micro Results   Recent Results (from the past 240 hour(s))  SARS Coronavirus 2 by RT PCR  (hospital order, performed in [facility] hospital lab) Nasopharyngeal Nasopharyngeal Swab     Status: None   Collection Time: 03/16/20  4:35 PM   Specimen: Nasopharyngeal Swab  Result Value Ref Range Status   SARS  Coronavirus 2 NEGATIVE NEGATIVE Final    Comment: (NOTE) SARS-CoV-2 target nucleic acids are NOT DETECTED. The SARS-CoV-2 RNA is generally detectable in upper and lower respiratory specimens during the acute phase of infection. The lowest concentration of SARS-CoV-2 viral copies this assay can detect is 250 copies / mL. A negative result does not preclude SARS-CoV-2 infection and should not be used as the sole basis for treatment or other patient management decisions.  A negative result may occur with improper specimen collection / handling, submission of specimen other than nasopharyngeal swab, presence of viral mutation(s) within the areas targeted by this assay, and inadequate number of viral copies (<250 copies / mL). A negative result must be combined with clinical observations, patient history, and epidemiological information. Fact Sheet for Patients:   BoilerBrush.com.cy Fact Sheet for Healthcare Providers: https://pope.com/ This test is not yet approved or cleared  by the [place] FDA and has been authorized for detection and/or diagnosis of SARS-CoV-2 by FDA under an Emergency Use Authorization (EUA).  This EUA will remain in effect (meaning this test can be used) for the duration of the COVID-19 declaration under Section 564(b)(1) of the Act, 21 U.S.C. section 360bbb-3(b)(1), unless the authorization is terminated or revoked sooner. Performed at [facility], [address] [place], [place] [address]        Today   Subjective    Raeleen Seville today has no new complaints --No productive cough today,  --Patient feels better, no fevers or chills   Patient has been seen and examined prior to discharge    Objective   Blood pressure (!) 125/57, pulse 69, temperature 98.1 F (36.7 C), temperature source Oral, resp. rate 16, height 5\' 6"  (1.676 m), weight 39.1 kg, SpO2 100 %.   Intake/Output Summary (Last 24 hours) at 03/18/2020 1307 Last data filed at 03/18/2020 0644 Gross per 24 hour  Intake 360 ml  Output 650 ml  Net -290 ml    Exam Gen:- Awake Alert, no acute distress , speaking in sentences HEENT:- [place].AT, No sclera icterus Nose -[place] 2L/min Neck-Supple Neck,No JVD,.  Lungs-improved air movement, no wheezing  CV- S1, S2 normal, irregular Abd-  +ve B.Sounds, Abd Soft, No tenderness,    Extremity/Skin:- No  edema,   good pulses Psych-affect is appropriate, oriented x3 Neuro-generalized weakness, no new focal deficits, no tremors    Data Review   CBC w Diff:  Lab Results  Component Value Date   WBC 11.2 (H) 03/16/2020   HGB 11.6 (L) 03/16/2020   HCT 38.3 03/16/2020   PLT 361 03/16/2020   LYMPHOPCT 17 03/16/2020   MONOPCT 8 03/16/2020   EOSPCT 1 03/16/2020   BASOPCT 1 03/16/2020    CMP:  Lab Results  Component Value Date   NA 136 03/18/2020   K 4.5 03/18/2020   CL 92 (L) 03/18/2020   CO2 36 (H) 03/18/2020   BUN 36 (H) 03/18/2020   CREATININE 1.14 (H) 03/18/2020   PROT 7.5 10/28/2019   ALBUMIN 3.3 (L) 10/28/2019   BILITOT 0.5 10/28/2019   ALKPHOS 85 10/28/2019   AST 35 10/28/2019   ALT 28 10/28/2019  .   Total Discharge time is about 33 minutes  10/30/2019 M.D on 03/18/2020 at 1:07 PM  Go to [email or web address] -  for contact info  [facility] - Office  [phone]

## 2020-03-18 NOTE — Care Management Important Message (Signed)
Important Message  Patient Details  Name: Brandy Carter MRN: [id number] Date of Birth: [birth year]   Medicare Important Message Given:  Yes     Corey Harold 03/18/2020, 12:50 PM

## 2020-03-18 NOTE — Evaluation (Signed)
Physical Therapy Evaluation Patient Details Name: Brandy Carter MRN: [id number] DOB: [birth year] Today's Date: 03/18/2020   History of Present Illness  Brandy Carter is a 86 y.o. female with medical history significant for  COPD with chronic respiratory failure, bronchiectasis, hypertension atrial fibrillation.Patient presented to the ED with complaints of difficulty breathing over the past 4 to 5 days.  Also with productive cough, and generalized weakness.  At baseline she is on 2 L of oxygen but she has had to increase this to 3 L as she is still having difficulty breathing.No chest pain, no lower extremity swelling.  She reports compliance with her medications including torsemide.  No vomiting, no loose stools.    Clinical Impression  Patient limited for functional mobility as stated below secondary to BLE weakness, fatigue and poor standing balance. Patient does not require assist for bed mobility but does require min assist for all other mobility today. She demonstrates good sitting balance EOB and good sitting tolerance. O2 sat at rest on 2L was at 89%, which decreased with activity to 87%. Patient feeling SOB following ambulation and she is instructed in pursed lip breating which increases saturation to 90% but she states she continues to feel limited by her breathing. Patient states she does not feel safe going home without more assistance being provided to her secondary to being home alone while family works. Patient overall limited by fatigue and impaired activity tolerance today. Patient will benefit from continued physical therapy in hospital and recommended venue below to increase strength, balance, endurance for safe ADLs and gait.     Follow Up Recommendations SNF;Supervision/Assistance - 24 hour;Supervision for mobility/OOB    Equipment Recommendations  None recommended by PT    Recommendations for Other Services       Precautions / Restrictions Precautions Precautions:  Fall Restrictions Weight Bearing Restrictions: No      Mobility  Bed Mobility Overal bed mobility: Needs Assistance Bed Mobility: Supine to Sit;Sit to Supine     Supine to sit: Supervision;HOB elevated Sit to supine: Supervision   General bed mobility comments: slow, labored movements; O2 sat 89% on 2 L at rest  Transfers Overall transfer level: Needs assistance Equipment used: Rolling walker (2 wheeled) Transfers: Sit to/from [employer] Sit to Stand: Min assist Stand pivot transfers: Min assist       General transfer comment: Physical assists needed for weakness, uses RW, unsteady upon standing  Ambulation/Gait Ambulation/Gait assistance: Min assist Gait Distance (Feet): 20 Feet Assistive device: Rolling walker (2 wheeled) Gait Pattern/deviations: Decreased step length - right;Decreased step length - left;Decreased stride length;Trunk flexed Gait velocity: decreased   General Gait Details: slow, labored cadence without loss of balance with RW, fatigues quickly on supplemental O2; O2 sat decreases to 87% with activity on 2L  Stairs            Wheelchair Mobility    Modified Rankin (Stroke Patients Only)       Balance Overall balance assessment: Needs assistance Sitting-balance support: Feet supported Sitting balance-Leahy Scale: Good Sitting balance - Comments: seated EOB   Standing balance support: Bilateral upper extremity supported Standing balance-Leahy Scale: Fair Standing balance comment: fair with RW                             Pertinent Vitals/Pain Pain Assessment: No/denies pain    Home Living Family/patient expects to be discharged to:: Private residence Living Arrangements: Children Available Help at Discharge: Family;Available  PRN/intermittently Type of Home: House Home Access: Stairs to enter Entrance Stairs-Rails: None Entrance Stairs-Number of Steps: 2 Home Layout: Two level;Able to live on main level  with bedroom/bathroom Home Equipment: Gilford Rile - 2 wheels;Cane - single point      Prior Function Level of Independence: Needs assistance   Gait / Transfers Assistance Needed: Patient states household ambulator without AD  ADL's / Homemaking Assistance Needed: Patient states increased difficulty taking care of herself recently with difficulty getting dressed/bathing and family is unable to provide the level of assist she needs        Hand Dominance        Extremity/Trunk Assessment   Upper Extremity Assessment Upper Extremity Assessment: Generalized weakness    Lower Extremity Assessment Lower Extremity Assessment: Generalized weakness    Cervical / Trunk Assessment Cervical / Trunk Assessment: Kyphotic  Communication   Communication: No difficulties  Cognition Arousal/Alertness: Awake/alert Behavior During Therapy: WFL for tasks assessed/performed Overall Cognitive Status: Within Functional Limits for tasks assessed                                        General Comments      Exercises     Assessment/Plan    PT Assessment Patient needs continued PT services  PT Problem List Decreased strength;Decreased mobility;Decreased activity tolerance;Decreased balance;Cardiopulmonary status limiting activity       PT Treatment Interventions DME instruction;Therapeutic exercise;Gait training;Balance training;Stair training;Neuromuscular re-education;Functional mobility training;Therapeutic activities;Patient/family education    PT Goals (Current goals can be found in the Care Plan section)  Acute Rehab PT Goals Patient Stated Goal: get strength back PT Goal Formulation: With patient Time For Goal Achievement: 04/01/20 Potential to Achieve Goals: Fair    Frequency Min 3X/week   Barriers to discharge        Co-evaluation               AM-PAC PT "6 Clicks" Mobility  Outcome Measure Help needed turning from your back to your side while in a  flat bed without using bedrails?: None Help needed moving from lying on your back to sitting on the side of a flat bed without using bedrails?: None Help needed moving to and from a bed to a chair (including a wheelchair)?: A Little Help needed standing up from a chair using your arms (e.g., wheelchair or bedside chair)?: A Little Help needed to walk in hospital room?: A Little Help needed climbing 3-5 steps with a railing? : A Lot 6 Click Score: 19    End of Session Equipment Utilized During Treatment: Gait belt;Oxygen Activity Tolerance: Patient limited by fatigue;Patient tolerated treatment well Patient left: in bed;with call bell/phone within reach;with bed alarm set Nurse Communication: Mobility status PT Visit Diagnosis: Unsteadiness on feet (R26.81);Other abnormalities of gait and mobility (R26.89);Muscle weakness (generalized) (M62.81)    Time: 1607-3710 PT Time Calculation (min) (ACUTE ONLY): 22 min   Charges:   PT Evaluation $PT Eval Moderate Complexity: 1 Mod PT Treatments $Therapeutic Activity: 8-22 mins        11:10 AM, 03/18/20 Mearl Latin PT, DPT Physical Therapist at [facility]

## 2020-03-18 NOTE — TOC Transition Note (Addendum)
Transition of Care [facility]) - CM/SW Discharge Note   Patient Details  Name: Brandy Carter MRN: [id number] Date of Birth: [birth year]  Transition of Care [facility]) CM/SW Contact:  Collie Kittel, Chrystine Oiler, RN Phone Number: 03/18/2020, 12:09 PM   Clinical Narrative:  Admit with COPD on home oxygen.  Patient is currently living with son, Brandy Carter in [place]. She has a RW. Recommended for SNF vs supervision. Discussed with patient, all options, including all rehab facilities in the county. Patient considered SNF but ultimately elected to go home with home health PT. Call to [facility], he is agreeable to for patient to come home, family is available to assist at times but not 24/7, and wanted to know could she walk to the restroom independently. Patient reports she can get to bathroom alone, we will order a BSC to help decrease distance of walking. BSC will be delivered to room prior to DC.  Referral for home health called to [facility], services to start next week, added information to AVS.  Patient will need PT and aide services.  DC home today.     Patient Goals and CMS Choice Patient states their goals for this hospitalization and ongoing recovery are:: wants to go home and regain her strength CMS Medicare.gov Compare Post Acute Care list provided to:: Patient(and son via phone) Choice offered to / list presented to : Patient, Adult Children       Discharge Plan and Services   Discharge Planning Services: CM Consult Post Acute Care Choice: Home Health, Durable Medical Equipment          DME Arranged: 3-N-1 DME Agency: [employer] Date DME Agency Contacted: 03/18/20 Time DME Agency Contacted: 1205 Representative spoke with at DME Agency: Olegario Messier HH Arranged: RN, PT, Nurse's Aide HH Agency: [facility] Date [facility] Agency Contacted: 03/18/20 Time HH Agency Contacted: 1206    Social Determinants of Health (SDOH) Interventions     Readmission Risk Interventions Readmission  Risk Prevention Plan 10/30/2019  Transportation Screening Complete  PCP or Specialist Appt within 3-5 Days Complete  HRI or Home Care Consult Complete  Social Work Consult for Recovery Care Planning/Counseling Complete  Palliative Care Screening Not Applicable  Medication Review [employer]) Complete

## 2021-03-04 IMAGING — DX DG CHEST 1V PORT
1 series · 1 of 1 positions shown · non-contrast
Comparison: 01/15/2019

CLINICAL DATA: Shortness of breath

EXAM:
PORTABLE CHEST 1 VIEW

[chest ap]
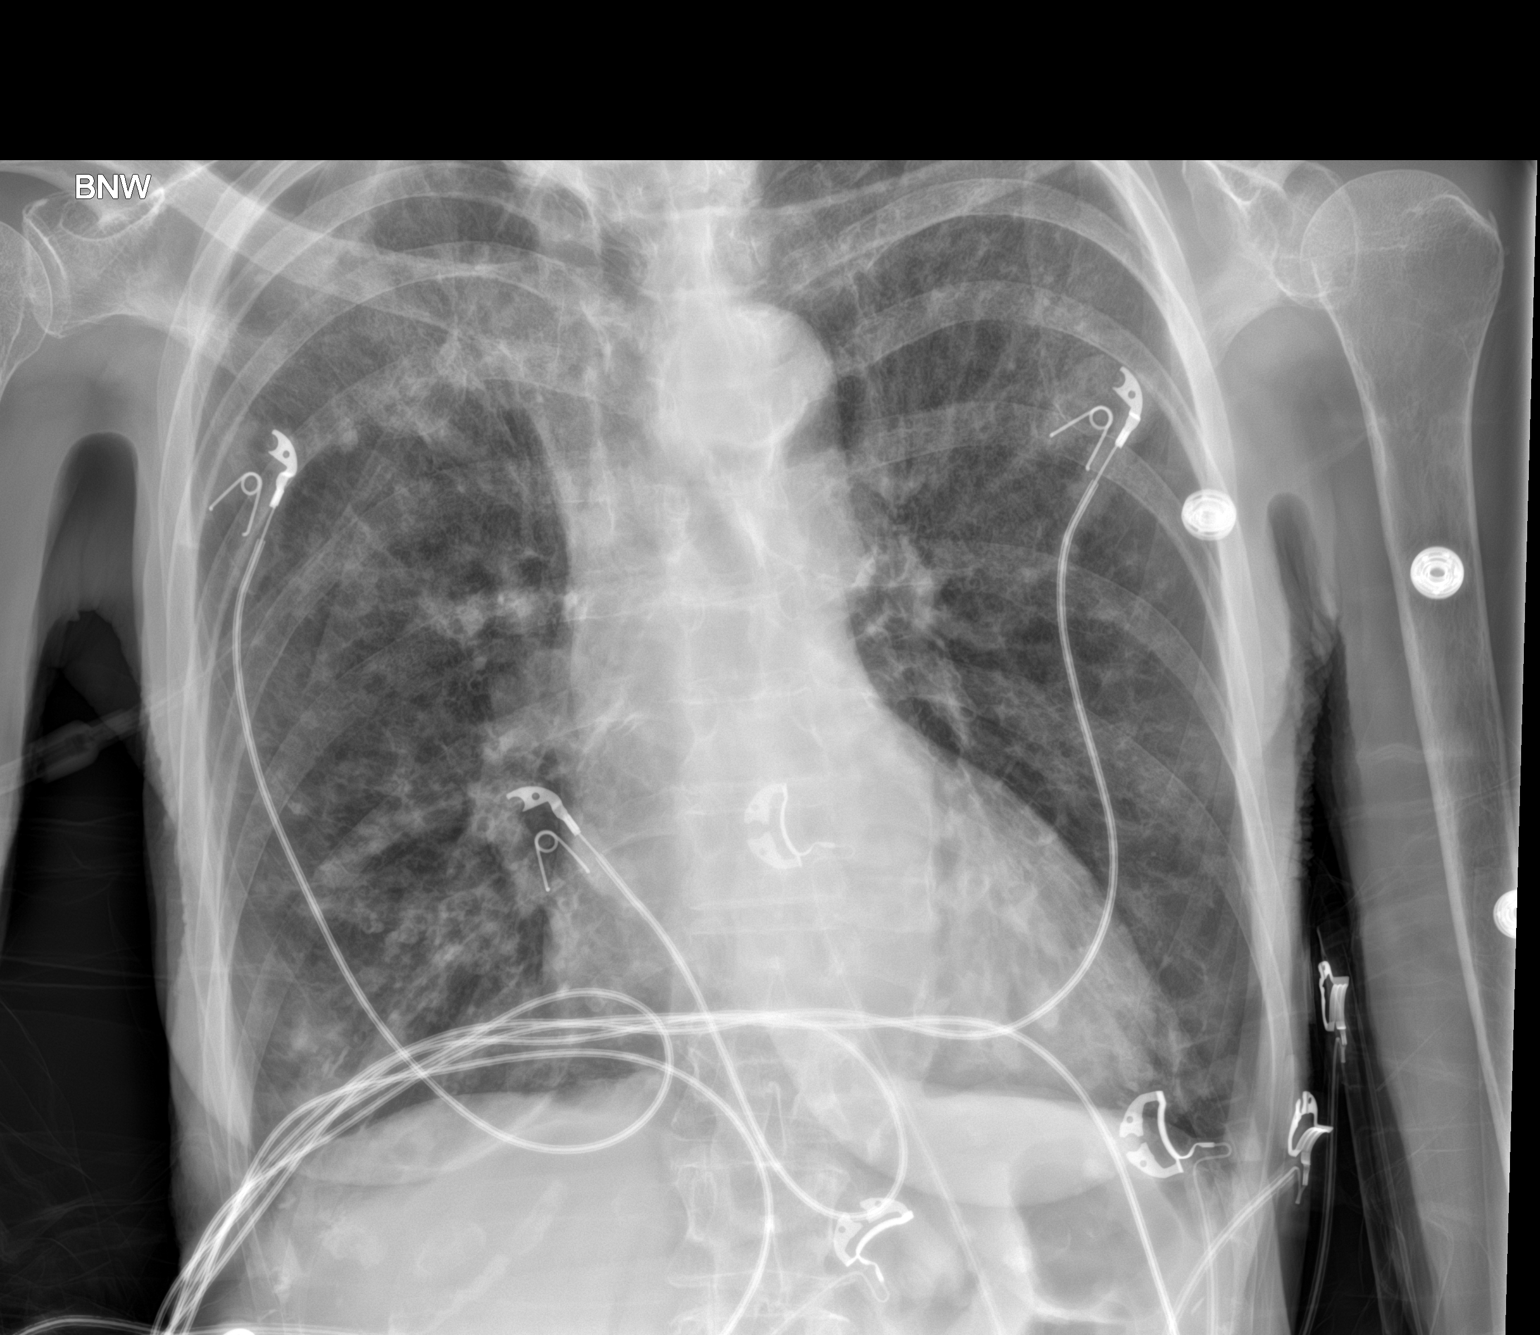

[1 of 1 positions shown; findings below may reference images not displayed]

FINDINGS: Pulmonary hyperinflation. Tubular lucencies in the infrahilar lung
suspicious for bronchiectasis. Nodules versus nodular foci of
airspace disease at the bases. No pleural effusion or pneumothorax.
Stable cardiomediastinal silhouette. Aortic atherosclerosis.
IMPRESSION: 1. Hyperinflated lungs
2. Suspected bilateral lower lobe bronchiectasis. Nodular opacities
at the bases, could be secondary to nodules, mucous plugging, or
developing pneumonia. CT could help to clarify.

## 2021-07-22 IMAGING — DX DG CHEST 1V PORT
1 series · 1 of 1 positions shown · non-contrast
Comparison: Chest x-ray 12/25/2019 and chest CT 10/28/2019

CLINICAL DATA: Cough and weakness.

EXAM:
PORTABLE CHEST 1 VIEW

[chest ap]
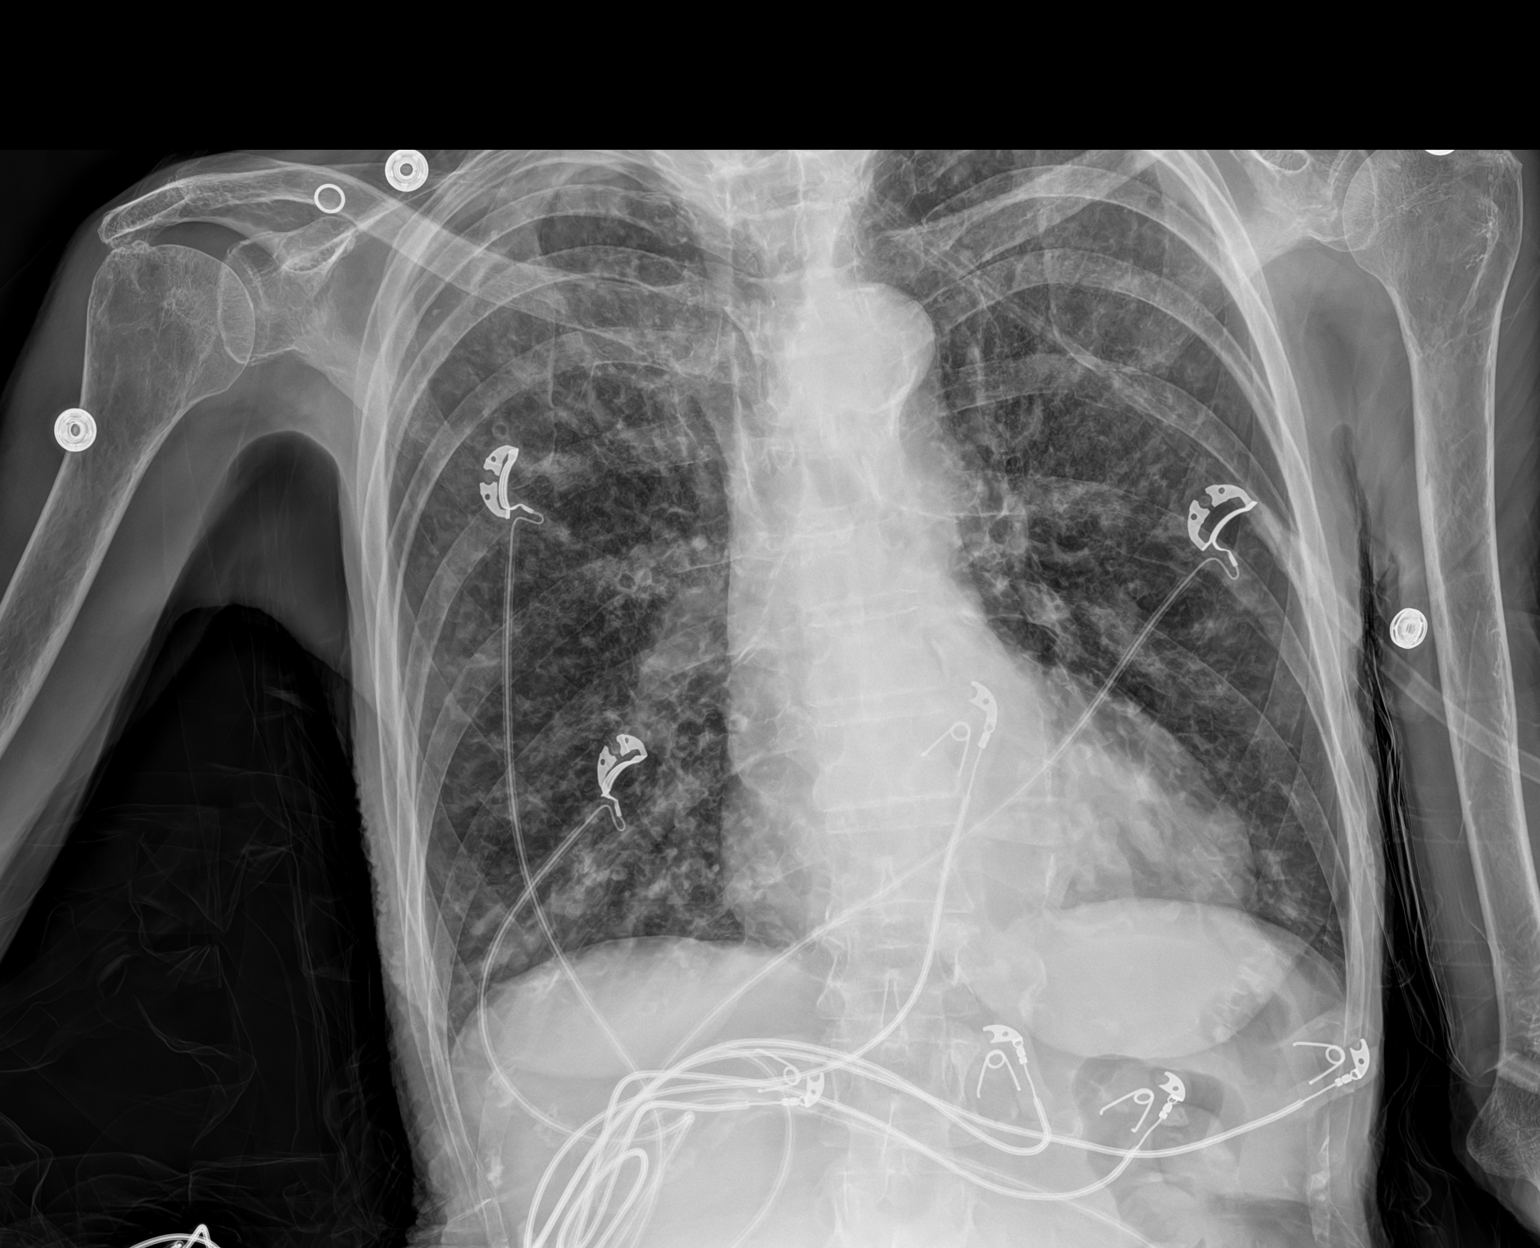

[1 of 1 positions shown; findings below may reference images not displayed]

FINDINGS: The cardiac silhouette, mediastinal and hilar contours are within
normal limits and stable. Stable mild tortuosity and calcification
of the thoracic aorta.

Stable emphysematous changes and pulmonary scarring. Stable fairly
extensive bronchiectasis. There are chronic bronchitic type
interstitial lung changes. No definite infiltrates or effusions. No
worrisome pulmonary lesions or pleural effusion.

The bony structures are intact
IMPRESSION: 1. Chronic lung changes with emphysema, bronchiectasis and chronic
bronchitic type interstitial lung changes.
2. No definite infiltrates or effusions.
